# Patient Record
Sex: Female | Born: 1935 | Race: White | Hispanic: No | Smoking: Never smoker
Health system: Southern US, Community
[De-identification: ages and names within clinical notes are randomized; demographics above are authoritative.]

## PROBLEM LIST (undated history)

## (undated) DIAGNOSIS — E119 Type 2 diabetes mellitus without complications: Secondary | ICD-10-CM

## (undated) DIAGNOSIS — I10 Essential (primary) hypertension: Secondary | ICD-10-CM

---

## 2005-02-21 ENCOUNTER — Ambulatory Visit: Payer: Self-pay | Admitting: Internal Medicine

## 2005-03-06 ENCOUNTER — Ambulatory Visit: Payer: Self-pay | Admitting: Internal Medicine

## 2005-04-11 ENCOUNTER — Ambulatory Visit: Payer: Self-pay | Admitting: Ophthalmology

## 2006-04-24 ENCOUNTER — Ambulatory Visit: Payer: Self-pay | Admitting: Internal Medicine

## 2007-05-08 ENCOUNTER — Ambulatory Visit: Payer: Self-pay | Admitting: Internal Medicine

## 2007-06-12 ENCOUNTER — Ambulatory Visit: Payer: Self-pay | Admitting: Internal Medicine

## 2008-08-25 ENCOUNTER — Ambulatory Visit: Payer: Self-pay | Admitting: Internal Medicine

## 2008-10-06 ENCOUNTER — Ambulatory Visit: Payer: Self-pay | Admitting: Internal Medicine

## 2009-12-15 ENCOUNTER — Ambulatory Visit: Payer: Self-pay | Admitting: Internal Medicine

## 2010-07-12 ENCOUNTER — Ambulatory Visit: Payer: Self-pay | Admitting: Internal Medicine

## 2011-01-19 ENCOUNTER — Ambulatory Visit: Payer: Self-pay | Admitting: Internal Medicine

## 2011-02-01 ENCOUNTER — Ambulatory Visit: Payer: Self-pay | Admitting: Internal Medicine

## 2011-04-19 ENCOUNTER — Ambulatory Visit: Payer: Self-pay | Admitting: Internal Medicine

## 2012-05-28 ENCOUNTER — Ambulatory Visit: Payer: Self-pay | Admitting: Ophthalmology

## 2012-05-28 LAB — POTASSIUM: Potassium: 4 mmol/L (ref 3.5–5.1)

## 2012-06-10 ENCOUNTER — Ambulatory Visit: Payer: Self-pay | Admitting: Ophthalmology

## 2014-04-21 ENCOUNTER — Inpatient Hospital Stay: Payer: Self-pay | Admitting: Internal Medicine

## 2014-04-21 LAB — URINALYSIS, COMPLETE
BILIRUBIN, UR: NEGATIVE
Glucose,UR: >=500 mg/dL (ref 0–75)
KETONE: NEGATIVE
NITRITE: POSITIVE
PH: 5 (ref 4.5–8.0)
Protein: 100
RBC,UR: 117 /HPF (ref 0–5)
SQUAMOUS EPITHELIAL: NONE SEEN
Specific Gravity: 1.014 (ref 1.003–1.030)
WBC UR: 121 /HPF (ref 0–5)

## 2014-04-21 LAB — CBC WITH DIFFERENTIAL/PLATELET
BASOS ABS: 0.1 10*3/uL (ref 0.0–0.1)
Basophil %: 0.4 %
EOS ABS: 0 10*3/uL (ref 0.0–0.7)
EOS PCT: 0 %
HCT: 35.7 % (ref 35.0–47.0)
HGB: 11.4 g/dL — ABNORMAL LOW (ref 12.0–16.0)
Lymphocyte #: 1.1 10*3/uL (ref 1.0–3.6)
Lymphocyte %: 5.4 %
MCH: 28.5 pg (ref 26.0–34.0)
MCHC: 31.8 g/dL — ABNORMAL LOW (ref 32.0–36.0)
MCV: 90 fL (ref 80–100)
MONO ABS: 1.6 x10 3/mm — AB (ref 0.2–0.9)
Monocyte %: 8.1 %
Neutrophil #: 16.9 10*3/uL — ABNORMAL HIGH (ref 1.4–6.5)
Neutrophil %: 86.1 %
Platelet: 211 10*3/uL (ref 150–440)
RBC: 3.98 10*6/uL (ref 3.80–5.20)
RDW: 14.4 % (ref 11.5–14.5)
WBC: 19.7 10*3/uL — ABNORMAL HIGH (ref 3.6–11.0)

## 2014-04-21 LAB — BASIC METABOLIC PANEL
Anion Gap: 7 (ref 7–16)
BUN: 30 mg/dL — ABNORMAL HIGH (ref 7–18)
CALCIUM: 9.6 mg/dL (ref 8.5–10.1)
Chloride: 102 mmol/L (ref 98–107)
Co2: 26 mmol/L (ref 21–32)
Creatinine: 1.14 mg/dL (ref 0.60–1.30)
EGFR (African American): 50 — ABNORMAL LOW
EGFR (Non-African Amer.): 43 — ABNORMAL LOW
Glucose: 265 mg/dL — ABNORMAL HIGH (ref 65–99)
Osmolality: 286 (ref 275–301)
Potassium: 3.7 mmol/L (ref 3.5–5.1)
SODIUM: 135 mmol/L — AB (ref 136–145)

## 2014-04-21 LAB — TROPONIN I: Troponin-I: <0.02 ng/mL

## 2014-04-22 LAB — BASIC METABOLIC PANEL
Anion Gap: 8 (ref 7–16)
BUN: 28 mg/dL — ABNORMAL HIGH (ref 7–18)
CALCIUM: 9.2 mg/dL (ref 8.5–10.1)
CO2: 27 mmol/L (ref 21–32)
Chloride: 103 mmol/L (ref 98–107)
Creatinine: 0.73 mg/dL (ref 0.60–1.30)
EGFR (African American): >60
EGFR (Non-African Amer.): >60
Glucose: 271 mg/dL — ABNORMAL HIGH (ref 65–99)
OSMOLALITY: 291 (ref 275–301)
Potassium: 3.1 mmol/L — ABNORMAL LOW (ref 3.5–5.1)
Sodium: 138 mmol/L (ref 136–145)

## 2014-04-22 LAB — CBC WITH DIFFERENTIAL/PLATELET
BASOS PCT: 0.1 %
Basophil #: 0 10*3/uL (ref 0.0–0.1)
Eosinophil #: 0 10*3/uL (ref 0.0–0.7)
Eosinophil %: 0 %
HCT: 32.5 % — AB (ref 35.0–47.0)
HGB: 10.7 g/dL — ABNORMAL LOW (ref 12.0–16.0)
LYMPHS PCT: 5 %
Lymphocyte #: 0.8 10*3/uL — ABNORMAL LOW (ref 1.0–3.6)
MCH: 29.2 pg (ref 26.0–34.0)
MCHC: 32.9 g/dL (ref 32.0–36.0)
MCV: 89 fL (ref 80–100)
MONO ABS: 1.2 x10 3/mm — AB (ref 0.2–0.9)
MONOS PCT: 7.6 %
NEUTROS ABS: 13.5 10*3/uL — AB (ref 1.4–6.5)
Neutrophil %: 87.3 %
Platelet: 187 10*3/uL (ref 150–440)
RBC: 3.66 10*6/uL — AB (ref 3.80–5.20)
RDW: 14 % (ref 11.5–14.5)
WBC: 15.4 10*3/uL — AB (ref 3.6–11.0)

## 2014-04-23 LAB — CBC WITH DIFFERENTIAL/PLATELET
Basophil #: 0 10*3/uL (ref 0.0–0.1)
Basophil %: 0.3 %
EOS ABS: 0 10*3/uL (ref 0.0–0.7)
Eosinophil %: 0.1 %
HCT: 31.1 % — ABNORMAL LOW (ref 35.0–47.0)
HGB: 10.2 g/dL — AB (ref 12.0–16.0)
Lymphocyte #: 0.7 10*3/uL — ABNORMAL LOW (ref 1.0–3.6)
Lymphocyte %: 6.5 %
MCH: 29.4 pg (ref 26.0–34.0)
MCHC: 33 g/dL (ref 32.0–36.0)
MCV: 89 fL (ref 80–100)
Monocyte #: 1 x10 3/mm — ABNORMAL HIGH (ref 0.2–0.9)
Monocyte %: 9 %
NEUTROS ABS: 9.6 10*3/uL — AB (ref 1.4–6.5)
NEUTROS PCT: 84.1 %
Platelet: 154 10*3/uL (ref 150–440)
RBC: 3.48 10*6/uL — AB (ref 3.80–5.20)
RDW: 14.1 % (ref 11.5–14.5)
WBC: 11.4 10*3/uL — AB (ref 3.6–11.0)

## 2014-04-23 LAB — BASIC METABOLIC PANEL
Anion Gap: 9 (ref 7–16)
BUN: 26 mg/dL — AB (ref 7–18)
CREATININE: 0.68 mg/dL (ref 0.60–1.30)
Calcium, Total: 9 mg/dL (ref 8.5–10.1)
Chloride: 106 mmol/L (ref 98–107)
Co2: 24 mmol/L (ref 21–32)
EGFR (Non-African Amer.): >60
Glucose: 208 mg/dL — ABNORMAL HIGH (ref 65–99)
Osmolality: 288 (ref 275–301)
Potassium: 3.1 mmol/L — ABNORMAL LOW (ref 3.5–5.1)
Sodium: 139 mmol/L (ref 136–145)

## 2014-04-24 LAB — BASIC METABOLIC PANEL
Anion Gap: 5 — ABNORMAL LOW (ref 7–16)
BUN: 21 mg/dL — ABNORMAL HIGH (ref 7–18)
Calcium, Total: 8.8 mg/dL (ref 8.5–10.1)
Chloride: 110 mmol/L — ABNORMAL HIGH (ref 98–107)
Co2: 25 mmol/L (ref 21–32)
Creatinine: 0.75 mg/dL (ref 0.60–1.30)
Glucose: 158 mg/dL — ABNORMAL HIGH (ref 65–99)
OSMOLALITY: 286 (ref 275–301)
Potassium: 3.8 mmol/L (ref 3.5–5.1)
Sodium: 140 mmol/L (ref 136–145)

## 2014-04-24 LAB — URINE CULTURE

## 2014-04-24 LAB — CULTURE, BLOOD (SINGLE)

## 2014-04-24 LAB — CBC WITH DIFFERENTIAL/PLATELET
Basophil #: 0 10*3/uL (ref 0.0–0.1)
Basophil %: 0.2 %
Eosinophil #: 0 10*3/uL (ref 0.0–0.7)
Eosinophil %: 0.3 %
HCT: 30.2 % — ABNORMAL LOW (ref 35.0–47.0)
HGB: 9.9 g/dL — ABNORMAL LOW (ref 12.0–16.0)
LYMPHS PCT: 10.7 %
Lymphocyte #: 1 10*3/uL (ref 1.0–3.6)
MCH: 29.3 pg (ref 26.0–34.0)
MCHC: 32.8 g/dL (ref 32.0–36.0)
MCV: 89 fL (ref 80–100)
MONOS PCT: 14.9 %
Monocyte #: 1.4 x10 3/mm — ABNORMAL HIGH (ref 0.2–0.9)
NEUTROS ABS: 7 10*3/uL — AB (ref 1.4–6.5)
Neutrophil %: 73.9 %
PLATELETS: 152 10*3/uL (ref 150–440)
RBC: 3.38 10*6/uL — ABNORMAL LOW (ref 3.80–5.20)
RDW: 14.2 % (ref 11.5–14.5)
WBC: 9.4 10*3/uL (ref 3.6–11.0)

## 2014-04-25 LAB — BASIC METABOLIC PANEL
Anion Gap: 4 — ABNORMAL LOW (ref 7–16)
BUN: 19 mg/dL — AB (ref 7–18)
CALCIUM: 8.7 mg/dL (ref 8.5–10.1)
Chloride: 110 mmol/L — ABNORMAL HIGH (ref 98–107)
Co2: 26 mmol/L (ref 21–32)
Creatinine: 0.77 mg/dL (ref 0.60–1.30)
EGFR (African American): >60
EGFR (Non-African Amer.): >60
Glucose: 147 mg/dL — ABNORMAL HIGH (ref 65–99)
Osmolality: 284 (ref 275–301)
POTASSIUM: 3.7 mmol/L (ref 3.5–5.1)
SODIUM: 140 mmol/L (ref 136–145)

## 2014-04-25 LAB — CBC WITH DIFFERENTIAL/PLATELET
Basophil #: 0 10*3/uL (ref 0.0–0.1)
Basophil %: 0.4 %
EOS ABS: 0.2 10*3/uL (ref 0.0–0.7)
Eosinophil %: 2.6 %
HCT: 30.5 % — ABNORMAL LOW (ref 35.0–47.0)
HGB: 9.9 g/dL — ABNORMAL LOW (ref 12.0–16.0)
LYMPHS PCT: 17.5 %
Lymphocyte #: 1.3 10*3/uL (ref 1.0–3.6)
MCH: 28.9 pg (ref 26.0–34.0)
MCHC: 32.3 g/dL (ref 32.0–36.0)
MCV: 89 fL (ref 80–100)
MONOS PCT: 15.1 %
Monocyte #: 1.2 x10 3/mm — ABNORMAL HIGH (ref 0.2–0.9)
Neutrophil #: 4.9 10*3/uL (ref 1.4–6.5)
Neutrophil %: 64.4 %
PLATELETS: 168 10*3/uL (ref 150–440)
RBC: 3.41 10*6/uL — ABNORMAL LOW (ref 3.80–5.20)
RDW: 14.6 % — ABNORMAL HIGH (ref 11.5–14.5)
WBC: 7.6 10*3/uL (ref 3.6–11.0)

## 2014-04-26 LAB — BASIC METABOLIC PANEL
Anion Gap: 6 — ABNORMAL LOW (ref 7–16)
BUN: 14 mg/dL (ref 7–18)
Calcium, Total: 8.6 mg/dL (ref 8.5–10.1)
Chloride: 109 mmol/L — ABNORMAL HIGH (ref 98–107)
Co2: 25 mmol/L (ref 21–32)
Creatinine: 0.48 mg/dL — ABNORMAL LOW (ref 0.60–1.30)
EGFR (Non-African Amer.): >60
Glucose: 134 mg/dL — ABNORMAL HIGH (ref 65–99)
Osmolality: 282 (ref 275–301)
Potassium: 3.2 mmol/L — ABNORMAL LOW (ref 3.5–5.1)
SODIUM: 140 mmol/L (ref 136–145)

## 2014-04-26 LAB — HEMOGLOBIN: HGB: 10.3 g/dL — AB (ref 12.0–16.0)

## 2014-04-27 LAB — BASIC METABOLIC PANEL
Anion Gap: 6 — ABNORMAL LOW (ref 7–16)
BUN: 12 mg/dL (ref 7–18)
Calcium, Total: 8.7 mg/dL (ref 8.5–10.1)
Chloride: 105 mmol/L (ref 98–107)
Co2: 27 mmol/L (ref 21–32)
Creatinine: 0.49 mg/dL — ABNORMAL LOW (ref 0.60–1.30)
GLUCOSE: 188 mg/dL — AB (ref 65–99)
OSMOLALITY: 280 (ref 275–301)
Potassium: 3.1 mmol/L — ABNORMAL LOW (ref 3.5–5.1)
Sodium: 138 mmol/L (ref 136–145)

## 2014-05-29 LAB — COMPREHENSIVE METABOLIC PANEL
ALBUMIN: 2.6 g/dL — AB (ref 3.4–5.0)
ALT: 15 U/L (ref 12–78)
Alkaline Phosphatase: 115 U/L
Anion Gap: 6 — ABNORMAL LOW (ref 7–16)
BUN: 20 mg/dL — ABNORMAL HIGH (ref 7–18)
Bilirubin,Total: 0.4 mg/dL (ref 0.2–1.0)
CO2: 26 mmol/L (ref 21–32)
Calcium, Total: 9.3 mg/dL (ref 8.5–10.1)
Chloride: 105 mmol/L (ref 98–107)
Creatinine: 1.3 mg/dL (ref 0.60–1.30)
EGFR (Non-African Amer.): 37 — ABNORMAL LOW
GFR CALC AF AMER: 43 — AB
GLUCOSE: 437 mg/dL — AB (ref 65–99)
Osmolality: 295 (ref 275–301)
Potassium: 4.7 mmol/L (ref 3.5–5.1)
SGOT(AST): 14 U/L — ABNORMAL LOW (ref 15–37)
SODIUM: 137 mmol/L (ref 136–145)
TOTAL PROTEIN: 7.6 g/dL (ref 6.4–8.2)

## 2014-05-29 LAB — CBC WITH DIFFERENTIAL/PLATELET
BASOS PCT: 0.5 %
Basophil #: 0.1 10*3/uL (ref 0.0–0.1)
EOS PCT: 0.2 %
Eosinophil #: 0.1 10*3/uL (ref 0.0–0.7)
HCT: 34.9 % — ABNORMAL LOW (ref 35.0–47.0)
HGB: 11.2 g/dL — ABNORMAL LOW (ref 12.0–16.0)
LYMPHS PCT: 6.6 %
Lymphocyte #: 1.7 10*3/uL (ref 1.0–3.6)
MCH: 28.2 pg (ref 26.0–34.0)
MCHC: 32.2 g/dL (ref 32.0–36.0)
MCV: 88 fL (ref 80–100)
MONOS PCT: 4.8 %
Monocyte #: 1.2 x10 3/mm — ABNORMAL HIGH (ref 0.2–0.9)
Neutrophil #: 22.6 10*3/uL — ABNORMAL HIGH (ref 1.4–6.5)
Neutrophil %: 87.9 %
PLATELETS: 532 10*3/uL — AB (ref 150–440)
RBC: 3.98 10*6/uL (ref 3.80–5.20)
RDW: 15.6 % — ABNORMAL HIGH (ref 11.5–14.5)
WBC: 25.7 10*3/uL — AB (ref 3.6–11.0)

## 2014-05-29 LAB — TROPONIN I: Troponin-I: <0.02 ng/mL

## 2014-05-29 LAB — LIPASE, BLOOD: LIPASE: 54 U/L — AB (ref 73–393)

## 2014-05-30 ENCOUNTER — Inpatient Hospital Stay: Payer: Self-pay | Admitting: Internal Medicine

## 2014-05-30 LAB — URINALYSIS, COMPLETE
BILIRUBIN, UR: NEGATIVE
Glucose,UR: >=500 mg/dL (ref 0–75)
Ketone: NEGATIVE
NITRITE: POSITIVE
Ph: 5 (ref 4.5–8.0)
RBC,UR: 33 /HPF (ref 0–5)
SPECIFIC GRAVITY: 1.017 (ref 1.003–1.030)
Squamous Epithelial: NONE SEEN

## 2014-05-31 LAB — CBC WITH DIFFERENTIAL/PLATELET
BASOS ABS: 0.1 10*3/uL (ref 0.0–0.1)
Basophil %: 0.3 %
EOS PCT: 1 %
Eosinophil #: 0.2 10*3/uL (ref 0.0–0.7)
HCT: 32.6 % — AB (ref 35.0–47.0)
HGB: 10.6 g/dL — AB (ref 12.0–16.0)
Lymphocyte #: 2 10*3/uL (ref 1.0–3.6)
Lymphocyte %: 10.9 %
MCH: 28.2 pg (ref 26.0–34.0)
MCHC: 32.5 g/dL (ref 32.0–36.0)
MCV: 87 fL (ref 80–100)
MONO ABS: 0.9 x10 3/mm (ref 0.2–0.9)
Monocyte %: 5.3 %
NEUTROS PCT: 82.5 %
Neutrophil #: 14.8 10*3/uL — ABNORMAL HIGH (ref 1.4–6.5)
Platelet: 474 10*3/uL — ABNORMAL HIGH (ref 150–440)
RBC: 3.76 10*6/uL — AB (ref 3.80–5.20)
RDW: 15.4 % — ABNORMAL HIGH (ref 11.5–14.5)
WBC: 17.9 10*3/uL — AB (ref 3.6–11.0)

## 2014-05-31 LAB — BASIC METABOLIC PANEL
Anion Gap: 8 (ref 7–16)
BUN: 13 mg/dL (ref 7–18)
CO2: 25 mmol/L (ref 21–32)
Calcium, Total: 8.7 mg/dL (ref 8.5–10.1)
Chloride: 111 mmol/L — ABNORMAL HIGH (ref 98–107)
Creatinine: 0.98 mg/dL (ref 0.60–1.30)
EGFR (African American): >60
EGFR (Non-African Amer.): 52 — ABNORMAL LOW
Glucose: 193 mg/dL — ABNORMAL HIGH (ref 65–99)
OSMOLALITY: 292 (ref 275–301)
Potassium: 3.9 mmol/L (ref 3.5–5.1)
SODIUM: 144 mmol/L (ref 136–145)

## 2014-05-31 LAB — MAGNESIUM: Magnesium: 1.3 mg/dL — ABNORMAL LOW

## 2014-05-31 LAB — CLOSTRIDIUM DIFFICILE(ARMC)

## 2014-06-02 LAB — BASIC METABOLIC PANEL
Anion Gap: 6 — ABNORMAL LOW (ref 7–16)
BUN: 11 mg/dL (ref 7–18)
Calcium, Total: 8.4 mg/dL — ABNORMAL LOW (ref 8.5–10.1)
Chloride: 108 mmol/L — ABNORMAL HIGH (ref 98–107)
Co2: 27 mmol/L (ref 21–32)
Creatinine: 0.99 mg/dL (ref 0.60–1.30)
EGFR (African American): 59 — ABNORMAL LOW
EGFR (Non-African Amer.): 51 — ABNORMAL LOW
Glucose: 224 mg/dL — ABNORMAL HIGH (ref 65–99)
Osmolality: 288 (ref 275–301)
POTASSIUM: 3.5 mmol/L (ref 3.5–5.1)
SODIUM: 141 mmol/L (ref 136–145)

## 2014-06-02 LAB — CBC WITH DIFFERENTIAL/PLATELET
Basophil #: 0.1 10*3/uL (ref 0.0–0.1)
Basophil %: 0.7 %
Eosinophil #: 0.3 10*3/uL (ref 0.0–0.7)
Eosinophil %: 2.2 %
HCT: 33.4 % — AB (ref 35.0–47.0)
HGB: 10.6 g/dL — ABNORMAL LOW (ref 12.0–16.0)
LYMPHS PCT: 17.7 %
Lymphocyte #: 2.1 10*3/uL (ref 1.0–3.6)
MCH: 27.6 pg (ref 26.0–34.0)
MCHC: 31.6 g/dL — ABNORMAL LOW (ref 32.0–36.0)
MCV: 87 fL (ref 80–100)
MONOS PCT: 6.9 %
Monocyte #: 0.8 x10 3/mm (ref 0.2–0.9)
Neutrophil #: 8.5 10*3/uL — ABNORMAL HIGH (ref 1.4–6.5)
Neutrophil %: 72.5 %
Platelet: 434 10*3/uL (ref 150–440)
RBC: 3.83 10*6/uL (ref 3.80–5.20)
RDW: 15.5 % — AB (ref 11.5–14.5)
WBC: 11.7 10*3/uL — ABNORMAL HIGH (ref 3.6–11.0)

## 2014-06-04 LAB — CULTURE, BLOOD (SINGLE)

## 2014-10-28 ENCOUNTER — Emergency Department (HOSPITAL_COMMUNITY): Payer: Medicare HMO

## 2014-10-28 ENCOUNTER — Encounter (HOSPITAL_COMMUNITY): Payer: Self-pay

## 2014-10-28 ENCOUNTER — Inpatient Hospital Stay (HOSPITAL_COMMUNITY)
Admission: EM | Admit: 2014-10-28 | Discharge: 2014-11-03 | DRG: 064 | Disposition: A | Discharge status: Skilled Nursing Facility | Payer: Medicare HMO | Attending: Neurology | Admitting: Neurology

## 2014-10-28 DIAGNOSIS — R1311 Dysphagia, oral phase: Secondary | ICD-10-CM | POA: Diagnosis present

## 2014-10-28 DIAGNOSIS — Z66 Do not resuscitate: Secondary | ICD-10-CM | POA: Diagnosis present

## 2014-10-28 DIAGNOSIS — G936 Cerebral edema: Secondary | ICD-10-CM | POA: Diagnosis present

## 2014-10-28 DIAGNOSIS — I619 Nontraumatic intracerebral hemorrhage, unspecified: Secondary | ICD-10-CM | POA: Diagnosis present

## 2014-10-28 DIAGNOSIS — I6203 Nontraumatic chronic subdural hemorrhage: Secondary | ICD-10-CM | POA: Diagnosis present

## 2014-10-28 DIAGNOSIS — S065XAA Traumatic subdural hemorrhage with loss of consciousness status unknown, initial encounter: Secondary | ICD-10-CM

## 2014-10-28 DIAGNOSIS — R4701 Aphasia: Secondary | ICD-10-CM | POA: Diagnosis present

## 2014-10-28 DIAGNOSIS — I69122 Dysarthria following nontraumatic intracerebral hemorrhage: Secondary | ICD-10-CM | POA: Diagnosis not present

## 2014-10-28 DIAGNOSIS — G8191 Hemiplegia, unspecified affecting right dominant side: Secondary | ICD-10-CM | POA: Diagnosis present

## 2014-10-28 DIAGNOSIS — E876 Hypokalemia: Secondary | ICD-10-CM

## 2014-10-28 DIAGNOSIS — B9620 Unspecified Escherichia coli [E. coli] as the cause of diseases classified elsewhere: Secondary | ICD-10-CM | POA: Diagnosis present

## 2014-10-28 DIAGNOSIS — E1165 Type 2 diabetes mellitus with hyperglycemia: Secondary | ICD-10-CM | POA: Diagnosis present

## 2014-10-28 DIAGNOSIS — Z880 Allergy status to penicillin: Secondary | ICD-10-CM | POA: Diagnosis not present

## 2014-10-28 DIAGNOSIS — S065X9A Traumatic subdural hemorrhage with loss of consciousness of unspecified duration, initial encounter: Secondary | ICD-10-CM

## 2014-10-28 DIAGNOSIS — I639 Cerebral infarction, unspecified: Secondary | ICD-10-CM

## 2014-10-28 DIAGNOSIS — Z79899 Other long term (current) drug therapy: Secondary | ICD-10-CM | POA: Diagnosis not present

## 2014-10-28 DIAGNOSIS — R29810 Facial weakness: Secondary | ICD-10-CM | POA: Diagnosis present

## 2014-10-28 DIAGNOSIS — I10 Essential (primary) hypertension: Secondary | ICD-10-CM

## 2014-10-28 DIAGNOSIS — I83009 Varicose veins of unspecified lower extremity with ulcer of unspecified site: Secondary | ICD-10-CM | POA: Diagnosis present

## 2014-10-28 DIAGNOSIS — N390 Urinary tract infection, site not specified: Secondary | ICD-10-CM

## 2014-10-28 DIAGNOSIS — I612 Nontraumatic intracerebral hemorrhage in hemisphere, unspecified: Secondary | ICD-10-CM

## 2014-10-28 DIAGNOSIS — I618 Other nontraumatic intracerebral hemorrhage: Principal | ICD-10-CM | POA: Diagnosis present

## 2014-10-28 HISTORY — DX: Essential (primary) hypertension: I10

## 2014-10-28 HISTORY — DX: Type 2 diabetes mellitus without complications: E11.9

## 2014-10-28 LAB — I-STAT CHEM 8, ED
BUN: 27 mg/dL — ABNORMAL HIGH (ref 6–23)
CALCIUM ION: 1.24 mmol/L (ref 1.13–1.30)
CREATININE: 1 mg/dL (ref 0.50–1.10)
Chloride: 102 mEq/L (ref 96–112)
GLUCOSE: 167 mg/dL — AB (ref 70–99)
HCT: 39 % (ref 36.0–46.0)
Hemoglobin: 13.3 g/dL (ref 12.0–15.0)
POTASSIUM: 4.1 meq/L (ref 3.7–5.3)
Sodium: 139 mEq/L (ref 137–147)
TCO2: 23 mmol/L (ref 0–100)

## 2014-10-28 LAB — COMPREHENSIVE METABOLIC PANEL
ALBUMIN: 3.6 g/dL (ref 3.5–5.2)
ALK PHOS: 69 U/L (ref 39–117)
ALT: 9 U/L (ref 0–35)
ANION GAP: 16 — AB (ref 5–15)
AST: 13 U/L (ref 0–37)
BILIRUBIN TOTAL: 0.3 mg/dL (ref 0.3–1.2)
BUN: 25 mg/dL — AB (ref 6–23)
CHLORIDE: 101 meq/L (ref 96–112)
CO2: 24 mEq/L (ref 19–32)
Calcium: 10.2 mg/dL (ref 8.4–10.5)
Creatinine, Ser: 0.93 mg/dL (ref 0.50–1.10)
GFR, EST AFRICAN AMERICAN: 62 mL/min — AB (ref >90)
GFR, EST NON AFRICAN AMERICAN: 53 mL/min — AB (ref >90)
GLUCOSE: 161 mg/dL — AB (ref 70–99)
Potassium: 4.3 mEq/L (ref 3.7–5.3)
Sodium: 141 mEq/L (ref 137–147)
Total Protein: 7.3 g/dL (ref 6.0–8.3)

## 2014-10-28 LAB — DIFFERENTIAL
Basophils Absolute: 0 10*3/uL (ref 0.0–0.1)
Basophils Relative: 0 % (ref 0–1)
Eosinophils Absolute: 0.1 10*3/uL (ref 0.0–0.7)
Eosinophils Relative: 1 % (ref 0–5)
LYMPHS ABS: 2 10*3/uL (ref 0.7–4.0)
Lymphocytes Relative: 18 % (ref 12–46)
MONO ABS: 0.7 10*3/uL (ref 0.1–1.0)
MONOS PCT: 6 % (ref 3–12)
NEUTROS ABS: 8.3 10*3/uL — AB (ref 1.7–7.7)
Neutrophils Relative %: 75 % (ref 43–77)

## 2014-10-28 LAB — CBC
HEMATOCRIT: 36.1 % (ref 36.0–46.0)
HEMOGLOBIN: 11.5 g/dL — AB (ref 12.0–15.0)
MCH: 27.5 pg (ref 26.0–34.0)
MCHC: 31.9 g/dL (ref 30.0–36.0)
MCV: 86.4 fL (ref 78.0–100.0)
Platelets: 290 10*3/uL (ref 150–400)
RBC: 4.18 MIL/uL (ref 3.87–5.11)
RDW: 15.9 % — ABNORMAL HIGH (ref 11.5–15.5)
WBC: 11.1 10*3/uL — ABNORMAL HIGH (ref 4.0–10.5)

## 2014-10-28 LAB — PROTIME-INR
INR: 1.16 (ref 0.00–1.49)
Prothrombin Time: 15 seconds (ref 11.6–15.2)

## 2014-10-28 LAB — I-STAT TROPONIN, ED: Troponin i, poc: 0.01 ng/mL (ref 0.00–0.08)

## 2014-10-28 LAB — APTT: aPTT: 33 seconds (ref 24–37)

## 2014-10-28 MED ORDER — STROKE: EARLY STAGES OF RECOVERY BOOK
Freq: Once | Status: AC
  Administered 2014-10-28: 22:00:00
  Filled 2014-10-28: qty 1

## 2014-10-28 MED ORDER — NICARDIPINE HCL IN NACL 20-0.86 MG/200ML-% IV SOLN
3.0000 mg/h | INTRAVENOUS | Status: DC
  Filled 2014-10-28: qty 200

## 2014-10-28 MED ORDER — ACETAMINOPHEN 650 MG RE SUPP: 650.0000 mg | RECTAL | Status: DC | PRN

## 2014-10-28 MED ORDER — LABETALOL HCL 5 MG/ML IV SOLN
10.0000 mg | INTRAVENOUS | Status: DC | PRN
  Administered 2014-10-29 – 2014-10-30 (×4): 20 mg via INTRAVENOUS
  Filled 2014-10-28: qty 4
  Filled 2014-10-28: qty 8
  Filled 2014-10-28: qty 4

## 2014-10-28 MED ORDER — LABETALOL HCL 5 MG/ML IV SOLN
10.0000 mg | Freq: Once | INTRAVENOUS | Status: AC
  Administered 2014-10-28: 10 mg via INTRAVENOUS
  Filled 2014-10-28: qty 4

## 2014-10-28 MED ORDER — SENNOSIDES-DOCUSATE SODIUM 8.6-50 MG PO TABS
1.0000 | ORAL_TABLET | Freq: Two times a day (BID) | ORAL | Status: DC
  Administered 2014-10-31 – 2014-11-03 (×6): 1 via ORAL
  Filled 2014-10-28 (×11): qty 1

## 2014-10-28 MED ORDER — ACETAMINOPHEN 325 MG PO TABS: 650.0000 mg | ORAL_TABLET | ORAL | Status: DC | PRN

## 2014-10-28 MED ORDER — PANTOPRAZOLE SODIUM 40 MG IV SOLR
40.0000 mg | Freq: Every day | INTRAVENOUS | Status: DC
  Administered 2014-10-29 – 2014-10-31 (×4): 40 mg via INTRAVENOUS
  Filled 2014-10-28 (×5): qty 40

## 2014-10-28 NOTE — ED Notes (Signed)
Family and pt on agreeance that pt would not like to be on a breathing tube.

## 2014-10-28 NOTE — ED Notes (Signed)
Per Dr. Amada JupiterKirkpatrick, ok to hold off on cardene drip due to BP 145/45.  Sts if pressure gets above 160, cardene drip may be started.

## 2014-10-28 NOTE — H&P (Addendum)
Neurology H&P  CC: Right-sided weakness  History is obtained from: Family, EMS   HPI: Sandy Harris is a 80 y.o. female with a history of hypertension and diabetes who presents with right-sided weakness. She was last seen normal around 4:30 PM, and then when her sister try to check on her she was unable to get a hold of her. EMS had to obtain access to the residence and found the patient collapsed on the floor. She had emesis at that time.  She is awake and the few things I am able to understand seem to make sense, but she is extraordinarily dysarthric.  She has markedly venous stasis changes with ulcerations bilaterally. There is some erythema, the family states that this is long-standing for months and the patient denies any pain.  LKW: 4:30 PM tpa given?: no, hemorrhage    ROS: Unable to obtain due to altered mental status.   Past Medical History  Diagnosis Date  . Hypertension   . Diabetes mellitus without complication     Family History: Unable to obtain due to altered mental status.  Social History: Tob: Unable to obtain due to altered mental status.  Exam: Current vital signs: BP 145/43 mmHg  Pulse 72  Resp 11  SpO2 99% Vital signs in last 24 hours: Pulse Rate:  [72-84] 72 (12/09 2030) Resp:  [11-15] 11 (12/09 2030) BP: (145-168)/(43-69) 145/43 mmHg (12/09 2030) SpO2:  [98 %-99 %] 99 % (12/09 2030)  General: In bed, c-collar in place  Physical Exam  Constitutional: Appears well-developed and well-nourished.  Psych: Affect appropriate to situation Eyes: No scleral injection HENT: No OP obstrucion Head: Normocephalic.  Cardiovascular: Normal rate and regular rhythm.  Respiratory: Effort normal and breath sounds normal to anterior ascultation GI: Soft.  No distension. There is no tenderness.  Skin: There are venous stasis changes with ulcerations bilaterally. There is some erythema.  Neuro: Mental Status: Patient is awake, alert, oriented to person,   month, age She nods and shakes head appropriately, but is very dysarthric to the point of not being able to understand her. Cranial Nerves: II: Visual Fields are full. Pupils are equal, round, and reactive to light.   III,IV, VI: She has a left gaze preference V: Facial sensation is decreased on the) VII: Facial movement is notable for right facial weakness VIII: hearing is intact to voice X: Uvula elevates symmetrically XI: Shoulder shrug is symmetric. XII: tongue is midline without atrophy or fasciculations.  Motor: Tone is normal. Bulk is normal. 5/5 strength was present in the left, she has minimal internal rotation of the right arm with increased tone, no voluntary movement of the right leg. Sensory: Sensation is decreased pinprick on the right Deep Tendon Reflexes: 2+ and symmetric in the biceps and decreased at patellae.  Cerebellar: She has a intentional tremor on finger-nose-finger on the left     I have reviewed labs in epic and the results pertinent to this consultation are: CMP - mildly elevated glucose, otherwise unremarkable CBC-mild leukocytosis  I have reviewed the images obtained: CT head-left thalamic hemorrhage  Impression: 80 year old female with left thalamic hemorrhage. Family and patient agree that she would not want to be intubated or have chest compressions in the event of a cardiac arrest or respiratory failure, but any care up to that point they would want to be aggressive.  She also has an extra-axial fluid collection most consistent with chronic subdural hematoma.   Recommendations: 1) Admit to ICU 2) no antiplatelets or  anticoagulants 3) blood pressure control with goal systolic <160 4) Frequent neuro checks 5) If symptoms worsen or there is decreased mental status, repeat stat head CT 6) PT,OT,ST 7) Nicardipine for elevated blood pressure. 8) Chronic venostasis ulcers - may want wound care consult in the morning.    This patient is critically  ill and at significant risk of neurological worsening, death and care requires constant monitoring of vital signs, hemodynamics,respiratory and cardiac monitoring, neurological assessment, discussion with family, other specialists and medical decision making of high complexity. I spent 50 minutes of neurocritical care time  in the care of  this patient.  Ritta SlotMcNeill Freyja Govea, MD [facility] [phone]  If 7pm- 7am, please page neurology on call as listed in AMION. 10/28/2014  8:58 PM

## 2014-10-28 NOTE — ED Notes (Addendum)
Pt from home with stroke symptoms.  Pt with right sided facial droop, and right sided weakness.  Pt found on floor by sister.  Pt had vomited; pt vomited once more with EMS.  LSN 1630 by family.

## 2014-10-29 ENCOUNTER — Encounter (HOSPITAL_COMMUNITY): Payer: Self-pay | Admitting: Neurology

## 2014-10-29 ENCOUNTER — Inpatient Hospital Stay (HOSPITAL_COMMUNITY): Payer: Medicare HMO

## 2014-10-29 DIAGNOSIS — I6309 Cerebral infarction due to thrombosis of other precerebral artery: Secondary | ICD-10-CM

## 2014-10-29 DIAGNOSIS — I610 Nontraumatic intracerebral hemorrhage in hemisphere, subcortical: Secondary | ICD-10-CM

## 2014-10-29 LAB — GLUCOSE, CAPILLARY
GLUCOSE-CAPILLARY: 166 mg/dL — AB (ref 70–99)
Glucose-Capillary: 180 mg/dL — ABNORMAL HIGH (ref 70–99)
Glucose-Capillary: 156 mg/dL — ABNORMAL HIGH (ref 70–99)

## 2014-10-29 LAB — MRSA PCR SCREENING: MRSA by PCR: POSITIVE — AB

## 2014-10-29 MED ORDER — HYDROCERIN EX CREA
TOPICAL_CREAM | Freq: Two times a day (BID) | CUTANEOUS | Status: DC
  Administered 2014-10-29: 11:00:00 via TOPICAL
  Administered 2014-10-29 – 2014-10-30 (×2): 1 via TOPICAL
  Administered 2014-10-30 – 2014-11-03 (×8): via TOPICAL
  Filled 2014-10-29: qty 113

## 2014-10-29 MED ORDER — SODIUM CHLORIDE 0.9 % IV SOLN
INTRAVENOUS | Status: DC
  Administered 2014-10-29 – 2014-10-31 (×3): via INTRAVENOUS

## 2014-10-29 MED ORDER — MUPIROCIN 2 % EX OINT
1.0000 "application " | TOPICAL_OINTMENT | Freq: Two times a day (BID) | CUTANEOUS | Status: DC
  Administered 2014-10-29 (×2): 1 via NASAL
  Filled 2014-10-29: qty 22

## 2014-10-29 MED ORDER — CHLORHEXIDINE GLUCONATE CLOTH 2 % EX PADS
6.0000 | MEDICATED_PAD | Freq: Every day | CUTANEOUS | Status: AC
  Administered 2014-10-29 – 2014-11-02 (×5): 6 via TOPICAL

## 2014-10-29 MED ORDER — MUPIROCIN 2 % EX OINT
1.0000 "application " | TOPICAL_OINTMENT | Freq: Two times a day (BID) | CUTANEOUS | Status: AC
  Administered 2014-10-29 – 2014-11-02 (×8): 1 via NASAL

## 2014-10-29 MED ORDER — CHLORHEXIDINE GLUCONATE 0.12 % MT SOLN
15.0000 mL | Freq: Two times a day (BID) | OROMUCOSAL | Status: DC
  Administered 2014-10-29 – 2014-11-03 (×10): 15 mL via OROMUCOSAL
  Filled 2014-10-29 (×10): qty 15

## 2014-10-29 MED ORDER — CETYLPYRIDINIUM CHLORIDE 0.05 % MT LIQD
7.0000 mL | Freq: Two times a day (BID) | OROMUCOSAL | Status: DC
  Administered 2014-10-29 – 2014-11-03 (×7): 7 mL via OROMUCOSAL

## 2014-10-29 MED ORDER — CETYLPYRIDINIUM CHLORIDE 0.05 % MT LIQD
7.0000 mL | Freq: Two times a day (BID) | OROMUCOSAL | Status: DC
  Administered 2014-10-29 (×2): 7 mL via OROMUCOSAL

## 2014-10-29 NOTE — Progress Notes (Signed)
Stroke Team Progress Note  HISTORY Sandy Harris is a 80 y.o. female with a history of hypertension and diabetes who presents with right-sided weakness. She was last seen normal around 4:30 PM, and then when her sister try to check on her she was unable to get a hold of her. EMS had to obtain access to the residence and found the patient collapsed on the floor. She had emesis at that time. She is awake and the few things I am able to understand seem to make sense, but she is extraordinarily dysarthric. She has markedly venous stasis changes with ulcerations bilaterally. There is some erythema, the family states that this is long-standing for months and the patient denies any pain.  LKW: 4:30 PM tpa given?: no, hemorrhage Patient was not administered TPA secondary to  Brain hemorrhage. She was admitted to the neuro ICU for further evaluation and treatment.  SUBJECTIVE Her  Family  Is not at the bedside.  Overall she feels her condition is unchanged. Her blood pressure has remained well controlled. There have been no significant neurological changes overnight. Follow-up CT scan shows slight increase in hematoma size and left-to-right midline shift but without hydrocephalus  OBJECTIVE Most recent Vital Signs: Filed Vitals:   10/29/14 1100 10/29/14 1200 10/29/14 1300 10/29/14 1400  BP: 140/43 151/62 151/83 143/51  Pulse: 74 88 75 75  Temp:  97.9 F (36.6 C)    TempSrc:  Oral    Resp: 13 12 16 12   Height:      Weight:      SpO2: 98% 94% 98% 98%   CBG (last 3)   Recent Labs  10/28/14 2124 10/29/14 0003 10/29/14 0520  GLUCAP 180* 166* 156*    IV Fluid Intake:   . sodium chloride 50 mL/hr at 10/29/14 1400  . niCARDipine      MEDICATIONS  . antiseptic oral rinse  7 mL Mouth Rinse q12n4p  . chlorhexidine  15 mL Mouth Rinse BID  . Chlorhexidine Gluconate Cloth  6 each Topical Q0600  . hydrocerin   Topical BID  . mupirocin ointment  1 application Nasal BID  . pantoprazole  (PROTONIX) IV  40 mg Intravenous QHS  . senna-docusate  1 tablet Oral BID   PRN:  acetaminophen **OR** acetaminophen, labetalol  Diet:  Diet NPO time specified   Activity:  Bedrest    DVT Prophylaxis:  SCDs CLINICALLY SIGNIFICANT STUDIES Basic Metabolic Panel:   Recent Labs Lab 10/28/14 1943 10/28/14 1952  NA 141 139  K 4.3 4.1  CL 101 102  CO2 24  --   GLUCOSE 161* 167*  BUN 25* 27*  CREATININE 0.93 1.00  CALCIUM 10.2  --    Liver Function Tests:   Recent Labs Lab 10/28/14 1943  AST 13  ALT 9  ALKPHOS 69  BILITOT 0.3  PROT 7.3  ALBUMIN 3.6   CBC:   Recent Labs Lab 10/28/14 1943 10/28/14 1952  WBC 11.1*  --   NEUTROABS 8.3*  --   HGB 11.5* 13.3  HCT 36.1 39.0  MCV 86.4  --   PLT 290  --    Coagulation:   Recent Labs Lab 10/28/14 1943  LABPROT 15.0  INR 1.16   Cardiac Enzymes: No results for input(s): CKTOTAL, CKMB, CKMBINDEX, TROPONINI in the last 168 hours. Urinalysis: No results for input(s): COLORURINE, LABSPEC, PHURINE, GLUCOSEU, HGBUR, BILIRUBINUR, KETONESUR, PROTEINUR, UROBILINOGEN, NITRITE, LEUKOCYTESUR in the last 168 hours.  Invalid input(s): APPERANCEUR Lipid PanelNo results found for: CHOL,  TRIG, HDL, CHOLHDL, VLDL, LDLCALC HgbA1C No results found for: HGBA1C  Urine Drug Screen:  No results found for: LABOPIA, COCAINSCRNUR, LABBENZ, AMPHETMU, THCU, LABBARB  Alcohol Level: No results for input(s): ETH in the last 168 hours.  Ct Head Wo Contrast  10/29/2014   CLINICAL DATA:  80 year old diabetic hypertensive female with facial droop and mental status changes with intracranial hemorrhage. Subsequent encounter.  EXAM: CT HEAD WITHOUT CONTRAST  TECHNIQUE: Contiguous axial images were obtained from the base of the skull through the vertex without intravenous contrast.  COMPARISON:  10/28/2014 CT.  FINDINGS: Increase in size of left thalamic hemorrhage now measuring 2.3 x 1.8 cm versus prior 1.7 x 1.6 cm. Increase amount of surrounding vasogenic  edema. Slight increased mass effect upon the left lateral ventricle with 5.4 mm midline shift versus prior 3.3 mm.  Small amount of blood is seen within the deep dependent aspect of the left lateral ventricle.  This constellation of findings may represent a hypertensive hemorrhage. Underlying parenchyma can be evaluated as this clears.  Asymmetric appearance of extra-axial spaces greater on the left. This may represent combination of subdural hygroma in addition to underlying atrophy.  Small vessel disease type changes. Remote infarct superior right lenticular nucleus extending to posterior right coronal radiata.  No intracranial mass lesion seen separate from above described findings.  No calvarial abnormality.  IMPRESSION: Increase in size of left thalamic hemorrhage now measuring 2.3 x 1.8 cm versus prior 1.7 x 1.6 cm. Increase amount of surrounding vasogenic edema. Slight increased mass effect upon the left lateral ventricle with 5.4 mm midline shift versus prior 3.3 mm.  Small amount of blood is seen within the deep dependent aspect of the left lateral ventricle.  Remainder of findings without significant change as noted above.  These results will be called to the ordering clinician or representative by the Radiologist Assistant, and communication documented in the PACS or zVision Dashboard.   Electronically Signed   By: Bridgett LarssonSteve  Olson M.D.   On: 10/29/2014 12:56   Ct Head (brain) Wo Contrast  10/28/2014   CLINICAL DATA:  Initial encounter for new onset facial droop and mental status changes.  EXAM: CT HEAD WITHOUT CONTRAST  TECHNIQUE: Contiguous axial images were obtained from the base of the skull through the vertex without intravenous contrast.  COMPARISON:  None.  FINDINGS: 1.6 cm intra-axial, intraparenchymal hemorrhage is identified in the left thalamus. There is a small amount of associated edema.  CSF density left sided supratentorial extra-axial fluid collections suggest the presence of a left-sided  chronic subdural hematoma. There is 3-4 mm of left to right midline shift which may be related to the hemorrhage and/or the chronic left extra-axial fluid collection.  Diffuse loss of parenchymal volume is consistent with atrophy. Lacunar infarct is identified in the right basal ganglia and corona radiata.  The visualized paranasal sinuses and mastoid air cells are clear.  IMPRESSION: Acute left-sided of the laminae can't hemorrhage with hematoma measuring about 1.6 cm.  Chronic left-sided extra-axial fluid collection measuring about 8 mm in thickness.  Three-4 mm left-to-right midline shift secondary to the left thalamic hemorrhage and/ or left extra-axial fluid collection.  Critical Value/emergent results were called by telephone at the time of interpretation on 10/28/2014 at 8:04 pm to Dr. Amada JupiterKirkpatrick, who verbally acknowledged these results.   Electronically Signed   By: Kennith CenterEric  Mansell M.D.   On: 10/28/2014 20:04       MRI of the brain  pending  MRA of the  brain  pending Carotid Doppler  Not ordered 2D Echocardiogram  Not ordered  CXR  pending  EKG   85 beats/min Sinus rhythm  Therapy Recommendations pending  Physical Exam   Elderly caucasian lady not in distress.    Afebrile. Head is nontraumatic. Neck is supple without bruit.  . Cardiac exam no murmur or gallop. Lungs are clear to auscultation. Distal pulses are well felt. Neurological Exam : Drowsy but can be easily aroused. Marked dysarthria and can speak a few words and short sentences and can be understood with great difficulty. Follows only occasional commands. Left gaze preference and looked partially to the right but not all the way. Blinks to threat on the left but not the right. Fundi were not visualized. Moderate right lower facial weakness. Tongue midline. Dense right hemiplegia with hypotonia in the right upper extremity. Marked paratonia in the right lower extremity with some tenderness around the right knee which is chronic per  the daughter. Purposeful antigravity strength on the left side. Right plantar upgoing left downgoing. Gait was not tested  ASSESSMENT [name] is a 79 y.o. female presenting with left thalamic hemorrhage likely hypertensive etiology with mild cytotoxic edema and mass effect on lateral ventricle with left to right brain shift but no hydrocephalus.    .  On no antiplatelet agents prior to admission. Now on no antiplatelet agentfor secondary stroke prevention. Patient with resultant dysarthria, mild aphasia, right facial droop and right hemiparesis. Stroke work up underway.   HT  Diabetes -mild hyperglycemia CBG 167   Hospital day # 1  TREATMENT/PLAN Continue close neurological monitoring and strict control of hypertension with blood pressure goal below 160 systolic for 24 hours and then 180 systolic thereafter. Repeat CT scan of the head does show slight increase in hematoma size and mass effect and midline shift but the patient is not a good candidate for long-term ventilation and aggressive measures. Patient is DO NOT RESUSCITATE but chemical code as per daughter. I had a long discussion with the daughter at the bedside and subsequently with her sister and son who came later as well. We will support her without doing aggressive measures were the next 1-2 days and see how she does. Family does not want aggressive measures including hypertonic saline or intubation or brain surgery I have personally examined this patient, reviewed notes, independently viewed imaging studies, participated in medical decision making and plan of care. I have made any additions or clarifications directly to the above note. Agree with note above.   This patient is critically ill and at significant risk of neurological worsening, death and care requires constant monitoring of vital signs, hemodynamics,respiratory and cardiac monitoring,review of multiple databases, neurological assessment, discussion with family,  other specialists and medical decision making of high complexity.I have made any additions or clarifications directly to the above note.  I spent 40 minutes of neurocritical care time  in the care of  this patient.  Delia Heady, MD Medical Director [facility] Stroke Center Pager: [phone] 10/29/2014 3:17 PM   SIGNED    To contact Stroke Continuity provider, please refer to [email or web address]. After hours, contact General Neurology

## 2014-10-29 NOTE — Plan of Care (Signed)
Problem: Acute Treatment Outcomes Goal: 02 Sats > 94% Outcome: Completed/Met Date Met:  10/29/14

## 2014-10-29 NOTE — Plan of Care (Signed)
Problem: Consults Goal: Intracranial Hemorrhage Patient Education See Patient Education Module for education specifics. Outcome: Progressing Goal: Skin Care Protocol Initiated - if Braden Score 18 or less If consults are not indicated, leave blank or document N/A Outcome: Not Progressing Goal: Diabetes Guidelines if Diabetic/Glucose > 140 If diabetic or lab glucose is > 140 mg/dl - Initiate Diabetes/Hyperglycemia Guidelines & Document Interventions  Outcome: Progressing  Problem: Acute Treatment Outcomes Goal: BP within ordered parameters Outcome: Progressing Goal: Airway maintained/protected Outcome: Progressing Goal: 02 Sats > 94% Outcome: Progressing Goal: Pain controlled Outcome: Completed/Met Date Met:  10/29/14 Goal: Nausea and vomiting controlled Outcome: Completed/Met Date Met:  10/29/14 Goal: Prognosis discussed with family/patient as appropriate Outcome: Progressing

## 2014-10-29 NOTE — Progress Notes (Signed)
PT Cancellation Note  Patient Details Name: Sandy Harris MRN: [id number] DOB: 01-23-1924   Cancelled Treatment:    Reason Eval/Treat Not Completed: Patient not medically ready.  Pt continues to be on bedrest at this time.  Will f/u tomorrow as appropriate.     Heith Haigler, Alison MurrayMegan F 10/29/2014, 10:43 AM

## 2014-10-29 NOTE — Progress Notes (Signed)
Due to pt's skin condition on her lower legs, Dr. Pearlean BrownieSethi d/c'd SCDs and anticipates starting lovenox Friday for DVT prophylaxis.

## 2014-10-29 NOTE — Evaluation (Signed)
Clinical/Bedside Swallow Evaluation Patient Details  Name: Sandy Harris MRN: [id number] Date of Birth: 1924-02-20  Today's Date: 10/29/2014 Time: 1211-1224 SLP Time Calculation (min) (ACUTE ONLY): 13 min  Past Medical History:  Past Medical History  Diagnosis Date  . Hypertension   . Diabetes mellitus without complication    Past Surgical History: History reviewed. No pertinent past surgical history. HPI:  Sandy Harris is an 80 y.o. female with a history of hypertension and diabetes who presents with right-sided weakness. CT Head shows left thalamic hemorrhage and midline shift.    Assessment / Plan / Recommendation Clinical Impression  Pt has limited movement of her articulators, with minimal labial opening to accept PO boluses that increases slightly with SLP cueing. A single ice chip elicits multiple swallows that lead into dry heaving. Pt was agreeable to additional trials of thin liquids and puree, however pt is unable propel the boluses through her oral cavity due to limited lingual and labial ROM. All liquids spilled anteriorly through lips, and purees were suctioned out by SLP. Pt is not yet ready for PO diet. SLP to return for continued PO trials to determine readiness as well as full cognitive-linguistic evaluation.    Aspiration Risk  Severe    Diet Recommendation NPO   Medication Administration: Via alternative means    Other  Recommendations Oral Care Recommendations: Oral care Q4 per protocol   Follow Up Recommendations  Inpatient Rehab;Skilled Nursing facility;24 hour supervision/assistance (pening PT/OT evaluations)    Frequency and Duration min 3x week  1 week   Pertinent Vitals/Pain Increase in RR (10 to 20) after swallowing ice chip    SLP Swallow Goals     Swallow Study Prior Functional Status       General Date of Onset: 10/28/14 HPI: Sandy Harris is an 80 y.o. female with a history of hypertension and diabetes who presents with right-sided  weakness. CT Head shows left thalamic hemorrhage and midline shift.  Type of Study: Bedside swallow evaluation Previous Swallow Assessment: none in chart Diet Prior to this Study: NPO Temperature Spikes Noted: No Respiratory Status: Room air History of Recent Intubation: No Behavior/Cognition: Alert;Cooperative;Requires cueing Oral Cavity - Dentition: Adequate natural dentition (as able to visualize) Self-Feeding Abilities: Needs assist Patient Positioning: Upright in bed Baseline Vocal Quality: Low vocal intensity (barely audible)    Oral/Motor/Sensory Function Overall Oral Motor/Sensory Function: Impaired (difficulty following commands, minimal movements noted) Facial Symmetry: Right droop   Ice Chips Ice chips: Impaired Presentation: Spoon Oral Phase Impairments: Reduced labial seal;Reduced lingual movement/coordination;Impaired anterior to posterior transit Oral Phase Functional Implications: Prolonged oral transit;Right anterior spillage;Left anterior spillage Pharyngeal Phase Impairments: Suspected delayed Swallow;Other (comments) (multiple swallows and dry heaving)   Thin Liquid Thin Liquid: Impaired Presentation: Spoon Oral Phase Impairments: Reduced labial seal;Poor awareness of bolus Oral Phase Functional Implications: Right anterior spillage;Left anterior spillage    Nectar Thick Nectar Thick Liquid: Not tested   Honey Thick Honey Thick Liquid: Not tested   Puree Puree: Impaired Presentation: Spoon Oral Phase Impairments: Reduced labial seal;Reduced lingual movement/coordination;Impaired anterior to posterior transit;Poor awareness of bolus Oral Phase Functional Implications: Right anterior spillage;Left anterior spillage   Solid   GO    Solid: Not tested        Maxcine HamLaura Paiewonsky, M.A. CCC-SLP [phone]  Maxcine Hamaiewonsky, Maurizio Geno 10/29/2014,1:35 PM

## 2014-10-29 NOTE — Consult Note (Addendum)
WOC wound consult note Reason for Consult: Consult requested for bilat legs.  Daughter at bedside states this is a chronic problem; they will resolve and heal, then return.  She wears compression stockings at home. Wound type: Scattered patchy areas of partial thickness skin loss to bilat anterior calves.  Generalized edema and erythremia, appearance consistent with venous stasis changes. Currently no significant open wounds or drainage which require topical treatment at this time. Measurement: Left great toe with chronic full thickness wound to tip; .5X.5X.1cm, dry scabbed area without odor or drainage.  Several blackened, long toenails which need to be trimmed; pt should follow-up with a podiatrist after discharge. Dressing procedure/placement/frequency: Eucerin cream to decrease occurrence of dry skin and itching and promote healing.  Bactroban to promote moist healing to left great toe.  Pt can resume compression stockings at home after discharge. Discussed plan of care with daughter at bedside; pt does not appear to understand but pt daughter denies further questions. Please re-consult if further assistance is needed.  Thank-you,  Cammie Mcgeeawn Yi Falletta MSN, RN, CWOCN, AlmaWCN-AP, CNS [phone]

## 2014-10-29 NOTE — Progress Notes (Signed)
UR completed.  Elo Marmolejos, RN BSN MHA CCM Trauma/Neuro ICU Case Manager [phone]  

## 2014-10-29 NOTE — Plan of Care (Signed)
Problem: Acute Treatment Outcomes Goal: Neuro exam at baseline or improved Outcome: Progressing     

## 2014-10-29 NOTE — Progress Notes (Signed)
OT Cancellation Note  Patient Details Name: Golden PopMattie H Wisehart MRN: [id number] DOB: [birth year]   Cancelled Treatment:    Reason Eval/Treat Not Completed: Patient not medically ready (BR)  Harolyn RutherfordJones, Acxel Dingee B  [phone] 10/29/2014, 7:14 AM

## 2014-10-29 NOTE — Plan of Care (Signed)
Problem: Consults Goal: Skin Care Protocol Initiated - if Braden Score 18 or less If consults are not indicated, leave blank or document N/A  Outcome: Completed/Met Date Met:  10/29/14  Problem: Acute Treatment Outcomes Goal: Prognosis discussed with family/patient as appropriate Outcome: Completed/Met Date Met:  10/29/14  Problem: Progression Outcomes Goal: If vent dependent, tolerates weaning Outcome: Not Applicable Date Met:  18/98/42

## 2014-10-30 MED ORDER — CIPROFLOXACIN IN D5W 400 MG/200ML IV SOLN
400.0000 mg | Freq: Once | INTRAVENOUS | Status: AC
  Administered 2014-10-30: 400 mg via INTRAVENOUS
  Filled 2014-10-30 (×2): qty 200

## 2014-10-30 MED ORDER — ENOXAPARIN SODIUM 40 MG/0.4ML ~~LOC~~ SOLN
40.0000 mg | Freq: Every morning | SUBCUTANEOUS | Status: DC
  Administered 2014-10-30 – 2014-11-03 (×5): 40 mg via SUBCUTANEOUS
  Filled 2014-10-30 (×6): qty 0.4

## 2014-10-30 NOTE — ED Provider Notes (Signed)
CSN: [id number]     Arrival date & time 10/28/14  1941 History   First MD Initiated Contact with Patient 10/28/14 2007     Chief Complaint  Patient presents with  . Code Stroke     (Consider location/radiation/quality/duration/timing/severity/associated sxs/prior Treatment) Patient is a 80 y.o. female presenting with neurologic complaint.  Neurologic Problem This is a new problem. The current episode started today. The problem occurs constantly. The problem has been unchanged. Pertinent negatives include no fever.    Past Medical History  Diagnosis Date  . Hypertension   . Diabetes mellitus without complication    History reviewed. No pertinent past surgical history. History reviewed. No pertinent family history. History  Substance Use Topics  . Smoking status: Never Smoker   . Smokeless tobacco: Not on file  . Alcohol Use: Not on file   OB History    No data available     Review of Systems  Unable to perform ROS: Mental status change  Constitutional: Negative for fever.      Allergies  Penicillins  Home Medications   Prior to Admission medications   Medication Sig Start Date End Date Taking? Authorizing Provider  acetaminophen (TYLENOL) 325 MG tablet Take 650 mg by mouth every 6 (six) hours as needed for mild pain.   Yes Historical Provider, MD  glipiZIDE (GLUCOTROL) 10 MG tablet Take 10 mg by mouth 2 (two) times daily before a meal.  10/19/14  Yes Historical Provider, MD  meloxicam (MOBIC) 7.5 MG tablet Take 7.5 mg by mouth daily.  10/19/14  Yes Historical Provider, MD  metFORMIN (GLUCOPHAGE) 1000 MG tablet Take 1,000 mg by mouth 2 (two) times daily with a meal.  10/19/14  Yes Historical Provider, MD  metoprolol tartrate (LOPRESSOR) 25 MG tablet Take 25 mg by mouth 2 (two) times daily.  10/19/14  Yes Historical Provider, MD  omeprazole (PRILOSEC) 20 MG capsule Take 20 mg by mouth daily.  10/19/14  Yes Historical Provider, MD  pioglitazone (ACTOS) 45 MG tablet  Take 45 mg by mouth daily.   Yes Historical Provider, MD  SYNTHROID 125 MCG tablet Take 125 mcg by mouth daily before breakfast.  10/19/14  Yes Historical Provider, MD  VOLTAREN 1 % GEL Apply 4 g topically 2 (two) times daily.  10/20/14  Yes Historical Provider, MD   BP 159/70 mmHg  Pulse 72  Temp(Src) 98.3 F (36.8 C) (Oral)  Resp 17  Ht 5\' 4"  (1.626 m)  Wt 179 lb 0.2 oz (81.2 kg)  BMI 30.71 kg/m2  SpO2 98% Physical Exam  Constitutional: She is oriented to person, place, and time. She appears well-developed and well-nourished.  HENT:  Head: Normocephalic and atraumatic.  Eyes: Conjunctivae and EOM are normal. Right eye exhibits no discharge. Left eye exhibits no discharge.  Cardiovascular: Normal rate and regular rhythm.   Pulmonary/Chest: Effort normal and breath sounds normal. No respiratory distress.  Abdominal: Soft. She exhibits no distension.  Musculoskeletal: Normal range of motion. She exhibits no edema.  Neurological: She is alert and oriented to person, place, and time. A cranial nerve deficit (right facial droop) is present.  Right upper extremity weakness, not cooperating with exam on lower extremities.   Skin: Skin is warm and dry.  Nursing note and vitals reviewed.   ED Course  Procedures (including critical care time) Labs Review Labs Reviewed  MRSA PCR SCREENING - Abnormal; Notable for the following:    MRSA by PCR POSITIVE (*)    All other components within  normal limits  CBC - Abnormal; Notable for the following:    WBC 11.1 (*)    Hemoglobin 11.5 (*)    RDW 15.9 (*)    All other components within normal limits  DIFFERENTIAL - Abnormal; Notable for the following:    Neutro Abs 8.3 (*)    All other components within normal limits  COMPREHENSIVE METABOLIC PANEL - Abnormal; Notable for the following:    Glucose, Bld 161 (*)    BUN 25 (*)    GFR calc non Af Amer 53 (*)    GFR calc Af Amer 62 (*)    Anion gap 16 (*)    All other components within normal  limits  GLUCOSE, CAPILLARY - Abnormal; Notable for the following:    Glucose-Capillary 180 (*)    All other components within normal limits  GLUCOSE, CAPILLARY - Abnormal; Notable for the following:    Glucose-Capillary 166 (*)    All other components within normal limits  GLUCOSE, CAPILLARY - Abnormal; Notable for the following:    Glucose-Capillary 156 (*)    All other components within normal limits  I-STAT CHEM 8, ED - Abnormal; Notable for the following:    BUN 27 (*)    Glucose, Bld 167 (*)    All other components within normal limits  URINE CULTURE  PROTIME-INR  APTT  CBG MONITORING, ED  I-STAT TROPOININ, ED    Imaging Review Ct Head Wo Contrast  10/29/2014   CLINICAL DATA:  80 year old diabetic hypertensive female with facial droop and mental status changes with intracranial hemorrhage. Subsequent encounter.  EXAM: CT HEAD WITHOUT CONTRAST  TECHNIQUE: Contiguous axial images were obtained from the base of the skull through the vertex without intravenous contrast.  COMPARISON:  10/28/2014 CT.  FINDINGS: Increase in size of left thalamic hemorrhage now measuring 2.3 x 1.8 cm versus prior 1.7 x 1.6 cm. Increase amount of surrounding vasogenic edema. Slight increased mass effect upon the left lateral ventricle with 5.4 mm midline shift versus prior 3.3 mm.  Small amount of blood is seen within the deep dependent aspect of the left lateral ventricle.  This constellation of findings may represent a hypertensive hemorrhage. Underlying parenchyma can be evaluated as this clears.  Asymmetric appearance of extra-axial spaces greater on the left. This may represent combination of subdural hygroma in addition to underlying atrophy.  Small vessel disease type changes. Remote infarct superior right lenticular nucleus extending to posterior right coronal radiata.  No intracranial mass lesion seen separate from above described findings.  No calvarial abnormality.  IMPRESSION: Increase in size of left  thalamic hemorrhage now measuring 2.3 x 1.8 cm versus prior 1.7 x 1.6 cm. Increase amount of surrounding vasogenic edema. Slight increased mass effect upon the left lateral ventricle with 5.4 mm midline shift versus prior 3.3 mm.  Small amount of blood is seen within the deep dependent aspect of the left lateral ventricle.  Remainder of findings without significant change as noted above.  These results will be called to the ordering clinician or representative by the Radiologist Assistant, and communication documented in the PACS or zVision Dashboard.   Electronically Signed   By: Bridgett LarssonSteve  Olson M.D.   On: 10/29/2014 12:56   Ct Head (brain) Wo Contrast  10/28/2014   CLINICAL DATA:  Initial encounter for new onset facial droop and mental status changes.  EXAM: CT HEAD WITHOUT CONTRAST  TECHNIQUE: Contiguous axial images were obtained from the base of the skull through the vertex without intravenous  contrast.  COMPARISON:  None.  FINDINGS: 1.6 cm intra-axial, intraparenchymal hemorrhage is identified in the left thalamus. There is a small amount of associated edema.  CSF density left sided supratentorial extra-axial fluid collections suggest the presence of a left-sided chronic subdural hematoma. There is 3-4 mm of left to right midline shift which may be related to the hemorrhage and/or the chronic left extra-axial fluid collection.  Diffuse loss of parenchymal volume is consistent with atrophy. Lacunar infarct is identified in the right basal ganglia and corona radiata.  The visualized paranasal sinuses and mastoid air cells are clear.  IMPRESSION: Acute left-sided of the laminae can't hemorrhage with hematoma measuring about 1.6 cm.  Chronic left-sided extra-axial fluid collection measuring about 8 mm in thickness.  Three-4 mm left-to-right midline shift secondary to the left thalamic hemorrhage and/ or left extra-axial fluid collection.  Critical Value/emergent results were called by telephone at the time of  interpretation on 10/28/2014 at 8:04 pm to Dr. Amada Jupiter, who verbally acknowledged these results.   Electronically Signed   By: Kennith Center M.D.   On: 10/28/2014 20:04     EKG Interpretation   Date/Time:  Wednesday October 28 2014 19:59:02 EST Ventricular Rate:  85 PR Interval:  195 QRS Duration: 92 QT Interval:  395 QTC Calculation: 470 R Axis:   62 Text Interpretation:  Sinus rhythm ED PHYSICIAN INTERPRETATION AVAILABLE  IN [facility] Confirmed by TEST, Record ([id number]) on 10/30/2014 8:05:45  AM      MDM   Final diagnoses:  CVA (cerebral infarction)  Urinary tract infection without hematuria, site unspecified    80 yo F here as code stroke 2/2 last known normal 1630, right sided facial droop and right sided weakness. Multiple episodes of vomiting, hypertensive, still not a&o here but does not want intubation. c collar cleared. Found to have head bleed. cardene initiated for BP, admitted to ICU with Neurology in guarded condition.     Marily Memos, MD 10/30/14 1713  Marily Memos, MD 10/30/14 6045  Purvis Sheffield, MD 10/30/14 2035

## 2014-10-30 NOTE — Progress Notes (Signed)
INITIAL NUTRITION ASSESSMENT  DOCUMENTATION CODES Per approved criteria  -Obesity Unspecified   INTERVENTION: Once diet advanced recommend adding Magic cup TID with meals, each supplement provides 290 kcal and 9 grams of protein  NUTRITION DIAGNOSIS: Increased nutrient needs related to wound healing as evidenced by estimated needs.   Goal: Pt to meet >/= 90% of their estimated nutrition needs   Monitor:  Diet advancement, PO intake, supplement acceptance, weight trends  Reason for Assessment: Low Braden  80 y.o. female  Admitting Dx: Left thalamic hemorrhage  ASSESSMENT: Pt sleepy. Per family pt has been going downhill for the last year. Pt was living at home alone when she had her first UTI. She was in the hospital about a week then went to ALF for 6 weeks. After returning home she had another UTI which required hospitalization and ALF as well. Per family pt was less and less mobile. She has venous statis ulcers as well as a stage II ulcer on her toe.  24 hr recall: Breakfast: cereal or cheese toast, Lunch: soup and sandwich, Dinner: chicken and frozen veggies.  Pt has continued to refuse ensure/boost at home.  SLP to reevaluate swallow 12/12 and is hopeful to start PO diet.   Nutrition Focused Physical Exam:  Subcutaneous Fat:  Orbital Region: WDL Upper Arm Region: WDL Thoracic and Lumbar Region: WDL  Muscle:  Temple Region: WDL Clavicle Bone Region: WDL Clavicle and Acromion Bone Region: WDL Scapular Bone Region: WDL Dorsal Hand: WDL Patellar Region: WDL Anterior Thigh Region: WDL Posterior Calf Region: WDL  Edema: not present   Height: Ht Readings from Last 1 Encounters:  10/28/14 5\' 4"  (1.626 m)    Weight: Wt Readings from Last 1 Encounters:  10/28/14 179 lb 0.2 oz (81.2 kg)    Ideal Body Weight: 54.5 kg   % Ideal Body Weight: 149%  Wt Readings from Last 10 Encounters:  10/28/14 179 lb 0.2 oz (81.2 kg)    Usual Body Weight: 180 lb   % Usual  Body Weight: 100%  BMI:  Body mass index is 30.71 kg/(m^2).  Estimated Nutritional Needs: Kcal: 1500-1700 Protein: 75-90 grams Fluid: > 1.5 L/day  Skin: venous statis ulcers and stage II on toe  Diet Order: Diet NPO time specified  EDUCATION NEEDS: -No education needs identified at this time   Intake/Output Summary (Last 24 hours) at 10/30/14 1415 Last data filed at 10/30/14 1000  Gross per 24 hour  Intake   1000 ml  Output      0 ml  Net   1000 ml    Last BM: PTA   Labs:   Recent Labs Lab 10/28/14 1943 10/28/14 1952  NA 141 139  K 4.3 4.1  CL 101 102  CO2 24  --   BUN 25* 27*  CREATININE 0.93 1.00  CALCIUM 10.2  --   GLUCOSE 161* 167*    CBG (last 3)   Recent Labs  10/28/14 2124 10/29/14 0003 10/29/14 0520  GLUCAP 180* 166* 156*    Scheduled Meds: . antiseptic oral rinse  7 mL Mouth Rinse q12n4p  . chlorhexidine  15 mL Mouth Rinse BID  . Chlorhexidine Gluconate Cloth  6 each Topical Q0600  . enoxaparin  40 mg Subcutaneous q morning - 10a  . hydrocerin   Topical BID  . mupirocin ointment  1 application Nasal BID  . pantoprazole (PROTONIX) IV  40 mg Intravenous QHS  . senna-docusate  1 tablet Oral BID    Continuous Infusions: .  sodium chloride 50 mL/hr at 10/30/14 0515  . niCARDipine      Past Medical History  Diagnosis Date  . Hypertension   . Diabetes mellitus without complication     History reviewed. No pertinent past surgical history.  Kendell BaneHeather Bernetha Anschutz RD, LDN, CNSC [phone] Pager [phone] After Hours Pager

## 2014-10-30 NOTE — Evaluation (Signed)
Occupational Therapy Evaluation Patient Details Name: Sandy Harris MRN: [id number] DOB: 06/27/1924 Today's Date: 10/30/2014    History of Present Illness Pt admitted with R side weakness and slurred speech due to L thalamic CVA.  PMH:  DM, HTN, chronic R knee pain requiring walker use.   Clinical Impression   Prior to admission, pt lived alone with intermittent assist of her family and medical alert button for safety.  She was modified independent in self care, light meal prep, and light housekeeping.  She primarily used a RW for mobility as she has a painful R knee.  Pt now presents with dense R hemiplegia with increased tone, L gaze preference, poorly intelligible speech and poor balance.  She requires +2 assist for bed level mobility.  Pt has a supportive family.  Will benefit from intense rehab.  Will follow acutely.    Follow Up Recommendations  Supervision/Assistance - 24 hour;CIR    Equipment Recommendations  3 in 1 bedside comode;Wheelchair (measurements OT);Wheelchair cushion (measurements OT);Hospital bed    Recommendations for Other Services Rehab consult     Precautions / Restrictions Precautions Precautions: Fall Precaution Comments: dense R hemiplegia Restrictions Weight Bearing Restrictions: No      Mobility Bed Mobility Overal bed mobility: +2 for physical assistance;Needs Assistance Bed Mobility: Rolling;Supine to Sit;Sit to Supine Rolling: +2 for physical assistance;Max assist   Supine to sit: +2 for physical assistance;Max assist Sit to supine: +2 for physical assistance;Max assist   General bed mobility comments: verbal and physical cues to sequence  Transfers                      Balance Overall balance assessment: Needs assistance Sitting-balance support: Feet supported Sitting balance-Leahy Scale: Poor   Postural control: Right lateral lean                                  ADL Overall ADL's : Needs  assistance/impaired Eating/Feeding: NPO   Grooming: Maximal assistance;Sitting   Upper Body Bathing: Total assistance;Sitting   Lower Body Bathing: Total assistance;Bed level;+2 for physical assistance   Upper Body Dressing : Maximal assistance;Sitting   Lower Body Dressing: Total assistance;Bed level;+2 for physical assistance                       Vision                 Additional Comments: wears glasses at baseline, L gaze preference, but turns head full range to track R   Perception     Praxis      Pertinent Vitals/Pain Pain Assessment: Faces Faces Pain Scale: No hurt Pain Intervention(s):  (sang with pt to distract)     Hand Dominance Right   Extremity/Trunk Assessment Upper Extremity Assessment Upper Extremity Assessment: RUE deficits/detail;LUE deficits/detail RUE Deficits / Details: increased flexor tone, no functional movement, +minimal shoulder subluxation RUE Sensation: decreased proprioception RUE Coordination: decreased fine motor;decreased gross motor LUE Deficits / Details: generalized weakness   Lower Extremity Assessment Lower Extremity Assessment: Defer to PT evaluation   Cervical / Trunk Assessment Cervical / Trunk Assessment: Kyphotic   Communication Communication Communication: Expressive difficulties   Cognition Arousal/Alertness: Lethargic Behavior During Therapy: Flat affect Overall Cognitive Status: Difficult to assess                     General Comments  Exercises       Shoulder Instructions      Home Living Family/patient expects to be discharged to:: Private residence Living Arrangements: Alone Available Help at Discharge: Family;Available 24 hours/day (family can arrange 24 hour care) Type of Home: House Home Access: Stairs to enter Entergy CorporationEntrance Stairs-Number of Steps: 1 Entrance Stairs-Rails: None Home Layout: One level     Bathroom Shower/Tub: Chief Strategy OfficerTub/shower unit   Bathroom Toilet: Standard      Home Equipment: Environmental consultantWalker - 2 wheels;Shower seat;Cane - single point   Additional Comments: Family willing to adapt home for accessibility.      Prior Functioning/Environment Level of Independence: Independent with assistive device(s)        Comments: used a RW primarily    OT Diagnosis: Generalized weakness;Hemiplegia dominant side;Disturbance of vision   OT Problem List: Impaired balance (sitting and/or standing);Decreased strength;Decreased cognition;Decreased safety awareness;Decreased activity tolerance;Decreased coordination;Impaired vision/perception;Decreased knowledge of use of DME or AE;Impaired sensation;Impaired tone;Obesity;Impaired UE functional use   OT Treatment/Interventions: Self-care/ADL training;Neuromuscular education;DME and/or AE instruction;Therapeutic activities;Visual/perceptual remediation/compensation;Patient/family education    OT Goals(Current goals can be found in the care plan section) Acute Rehab OT Goals OT Goal Formulation: With patient Time For Goal Achievement: 11/13/14 Potential to Achieve Goals: Good ADL Goals Pt Will Perform Grooming: with min assist;sitting (supported) Pt Will Perform Upper Body Dressing: with min assist;sitting (supported) Pt Will Transfer to Toilet: with +2 assist;with mod assist;stand pivot transfer Pt/caregiver will Perform Home Exercise Program: Increased ROM;Right Upper extremity;With Supervision (with family) Additional ADL Goal #1: Pt/family will be independent in positioning R UE and protecting from injury. Additional ADL Goal #2: Pt will perform bed mobility with +2 min assist. Additional ADL Goal #3: Pt will sit EOB x 5 minutes with min assist while participating in activity involving UE use.  OT Frequency: Min 3X/week   Barriers to D/C:            Co-evaluation PT/OT/SLP Co-Evaluation/Treatment: Yes Reason for Co-Treatment: Necessary to address cognition/behavior during functional activity   OT goals  addressed during session: ADL's and self-care      End of Session    Activity Tolerance: Patient limited by fatigue Patient left: in bed;with call bell/phone within reach   Time: [id number] OT Time Calculation (min): 41 min Charges:  OT General Charges $OT Visit: 1 Procedure OT Evaluation $Initial OT Evaluation Tier I: 1 Procedure OT Treatments $Neuromuscular Re-education: 8-22 mins G-Codes:    Evern BioMayberry, Ferol Laiche Lynn 10/30/2014, 2:21 PM  [phone]

## 2014-10-30 NOTE — Evaluation (Signed)
Speech Language Pathology Evaluation Patient Details Name: Sandy Harris Waid MRN: [id number] DOB: 1923-12-05 Today's Date: 10/30/2014 Time: [id number] SLP Time Calculation (min) (ACUTE ONLY): 37 min  Problem List:  Patient Active Problem List   Diagnosis Date Noted  . CVA (cerebral infarction)   . ICH (intracerebral hemorrhage) 10/28/2014   Past Medical History:  Past Medical History  Diagnosis Date  . Hypertension   . Diabetes mellitus without complication    Past Surgical History: History reviewed. No pertinent past surgical history. HPI:  Sandy Harris Kinsel is an 80 y.o. female with a history of hypertension and diabetes who presents with right-sided weakness. CT Head shows left thalamic hemorrhage and midline shift.    Assessment / Plan / Recommendation Clinical Impression  Pt has a profound dysarthria, with limited intelligibility at the word and short phrase level. Speech is impacted by decreased breath support and imprecise articulation from right-sided weakness, with minimal increase in intelligibility despite multimodal cueing from SLP for use of compensatory strategies. Attempted use of a communication board, however patient was unable to locate items on the board. Pt does answer basic and mildly complex yes/no questions with high accuracy, and follows one-step commands with Min cues from therapist. Basic comprehension appears to be a strength, although additional assessment of cognitive-linguistic skills will need to be addressed in future sessions as pt's communication progresses.     SLP Assessment  Patient needs continued Speech Lanaguage Pathology Services    Follow Up Recommendations  Inpatient Rehab;Skilled Nursing facility;24 hour supervision/assistance (pending PT/OT evaluations)    Frequency and Duration min 2x/week  2 weeks   Pertinent Vitals/Pain Pain Assessment: Faces Faces Pain Scale: No hurt Pain Intervention(s):  (sang with pt to distract)   SLP Goals   Progression toward goals: Progressing toward goals Patient/Family Stated Goal: none stated Potential to Achieve Goals (ACUTE ONLY): Good Potential Considerations (ACUTE ONLY): Severity of impairments  SLP Evaluation Prior Functioning  Type of Home: House Available Help at Discharge: Family;Available 24 hours/day (family can arrange 24 hour care)   Cognition  Overall Cognitive Status: Difficult to assess Arousal/Alertness: Awake/alert Orientation Level: Oriented to person Attention: Sustained Sustained Attention: Impaired Sustained Attention Impairment: Functional basic Memory: Impaired Memory Impairment: Decreased recall of new information Awareness: Impaired Awareness Impairment: Emergent impairment    Comprehension  Auditory Comprehension Overall Auditory Comprehension: Impaired Yes/No Questions: Impaired Basic Biographical Questions: 76-100% accurate Basic Immediate Environment Questions: 75-100% accurate Complex Questions: 75-100% accurate Paragraph Comprehension (via yes/no questions): 51-75% accurate Commands: Impaired One Step Basic Commands: 75-100% accurate Conversation: Simple Visual Recognition/Discrimination Discrimination: Not tested Reading Comprehension Reading Status: Not tested    Expression Expression Primary Mode of Expression: Verbal Verbal Expression Overall Verbal Expression: Other (comment) (difficult to assess due to dysarthria) Written Expression Dominant Hand: Right Written Expression: Not tested   Oral / Motor Oral Motor/Sensory Function Overall Oral Motor/Sensory Function: Impaired Labial ROM: Reduced right Labial Symmetry: Abnormal symmetry right Labial Strength: Reduced Labial Sensation: Reduced Lingual ROM: Reduced right;Reduced left Lingual Symmetry: Within Functional Limits Lingual Strength: Reduced Facial ROM: Reduced right Facial Symmetry: Right droop Facial Strength: Reduced Motor Speech Overall Motor Speech:  Impaired Respiration: Impaired Level of Impairment: Phrase Phonation: Low vocal intensity Articulation: Impaired Level of Impairment: Word Intelligibility: Intelligibility reduced Word: 0-24% accurate Phrase: 0-24% accurate   GO      Maxcine HamLaura Paiewonsky, M.A. CCC-SLP [phone]  Maxcine Hamaiewonsky, Tarin Johndrow 10/30/2014, 2:41 PM

## 2014-10-30 NOTE — Progress Notes (Signed)
OT Cancellation Note  Patient Details Name: Sandy Harris MRN: [id number] DOB: Apr 13, 1924   Cancelled Treatment:    Reason Eval/Treat Not Completed: Other (comment). Pt continues on bedrest.  Evette GeorgesLeonard, Josemaria Brining Eva [phone] 10/30/2014, 7:44 AM

## 2014-10-30 NOTE — Progress Notes (Signed)
Stroke Team Progress Note  HISTORY Sandy Harris is a 80 y.o. female with a history of hypertension and diabetes who presents with right-sided weakness. She was last seen normal around 4:30 PM, and then when her sister try to check on her she was unable to get a hold of her. EMS had to obtain access to the residence and found the patient collapsed on the floor. She had emesis at that time. She is awake and the few things I am able to understand seem to make sense, but she is extraordinarily dysarthric. She has markedly venous stasis changes with ulcerations bilaterally. There is some erythema, the family states that this is long-standing for months and the patient denies any pain.  LKW: 4:30 PM tpa given?: no, hemorrhage Patient was not administered TPA secondary to  Brain hemorrhage. She was admitted to the neuro ICU for further evaluation and treatment.  SUBJECTIVE Her  Family  Is not at the bedside.   Overall she feels her condition is unchanged. Her blood pressure has remained well controlled. There have been no significant neurological changes overnight. Follow-up CT scan  12/10/15showed slight increase in hematoma size and left-to-right midline shift but without hydrocephalus  OBJECTIVE Most recent Vital Signs: Filed Vitals:   10/30/14 0900 10/30/14 1000 10/30/14 1145 10/30/14 1458  BP: 150/66 165/83 182/54 159/70  Pulse: 71 74 74 72  Temp:   98.4 F (36.9 C) 98.3 F (36.8 C)  TempSrc:   Oral Oral  Resp: 11 17 17    Height:      Weight:      SpO2: 95% 95% 94% 98%   CBG (last 3)   Recent Labs  10/28/14 2124 10/29/14 0003 10/29/14 0520  GLUCAP 180* 166* 156*    IV Fluid Intake:   . sodium chloride 50 mL/hr at 10/30/14 0515    MEDICATIONS  . antiseptic oral rinse  7 mL Mouth Rinse q12n4p  . chlorhexidine  15 mL Mouth Rinse BID  . Chlorhexidine Gluconate Cloth  6 each Topical Q0600  . enoxaparin  40 mg Subcutaneous q morning - 10a  . hydrocerin   Topical BID  .  mupirocin ointment  1 application Nasal BID  . pantoprazole (PROTONIX) IV  40 mg Intravenous QHS  . senna-docusate  1 tablet Oral BID   PRN:  acetaminophen **OR** acetaminophen, labetalol  Diet:  Diet NPO time specified   Activity:  Bedrest    DVT Prophylaxis:  SCDs CLINICALLY SIGNIFICANT STUDIES Basic Metabolic Panel:   Recent Labs Lab 10/28/14 1943 10/28/14 1952  NA 141 139  K 4.3 4.1  CL 101 102  CO2 24  --   GLUCOSE 161* 167*  BUN 25* 27*  CREATININE 0.93 1.00  CALCIUM 10.2  --    Liver Function Tests:   Recent Labs Lab 10/28/14 1943  AST 13  ALT 9  ALKPHOS 69  BILITOT 0.3  PROT 7.3  ALBUMIN 3.6   CBC:   Recent Labs Lab 10/28/14 1943 10/28/14 1952  WBC 11.1*  --   NEUTROABS 8.3*  --   HGB 11.5* 13.3  HCT 36.1 39.0  MCV 86.4  --   PLT 290  --    Coagulation:   Recent Labs Lab 10/28/14 1943  LABPROT 15.0  INR 1.16   Cardiac Enzymes: No results for input(s): CKTOTAL, CKMB, CKMBINDEX, TROPONINI in the last 168 hours. Urinalysis: No results for input(s): COLORURINE, LABSPEC, PHURINE, GLUCOSEU, HGBUR, BILIRUBINUR, KETONESUR, PROTEINUR, UROBILINOGEN, NITRITE, LEUKOCYTESUR in the last 168  hours.  Invalid input(s): APPERANCEUR Lipid PanelNo results found for: CHOL, TRIG, HDL, CHOLHDL, VLDL, LDLCALC HgbA1C No results found for: HGBA1C  Urine Drug Screen:  No results found for: LABOPIA, COCAINSCRNUR, LABBENZ, AMPHETMU, THCU, LABBARB  Alcohol Level: No results for input(s): ETH in the last 168 hours.  Ct Head Wo Contrast  10/29/2014   CLINICAL DATA:  80 year old diabetic hypertensive female with facial droop and mental status changes with intracranial hemorrhage. Subsequent encounter.  EXAM: CT HEAD WITHOUT CONTRAST  TECHNIQUE: Contiguous axial images were obtained from the base of the skull through the vertex without intravenous contrast.  COMPARISON:  10/28/2014 CT.  FINDINGS: Increase in size of left thalamic hemorrhage now measuring 2.3 x 1.8 cm versus  prior 1.7 x 1.6 cm. Increase amount of surrounding vasogenic edema. Slight increased mass effect upon the left lateral ventricle with 5.4 mm midline shift versus prior 3.3 mm.  Small amount of blood is seen within the deep dependent aspect of the left lateral ventricle.  This constellation of findings may represent a hypertensive hemorrhage. Underlying parenchyma can be evaluated as this clears.  Asymmetric appearance of extra-axial spaces greater on the left. This may represent combination of subdural hygroma in addition to underlying atrophy.  Small vessel disease type changes. Remote infarct superior right lenticular nucleus extending to posterior right coronal radiata.  No intracranial mass lesion seen separate from above described findings.  No calvarial abnormality.  IMPRESSION: Increase in size of left thalamic hemorrhage now measuring 2.3 x 1.8 cm versus prior 1.7 x 1.6 cm. Increase amount of surrounding vasogenic edema. Slight increased mass effect upon the left lateral ventricle with 5.4 mm midline shift versus prior 3.3 mm.  Small amount of blood is seen within the deep dependent aspect of the left lateral ventricle.  Remainder of findings without significant change as noted above.  These results will be called to the ordering clinician or representative by the Radiologist Assistant, and communication documented in the PACS or zVision Dashboard.   Electronically Signed   By: Bridgett Larsson M.D.   On: 10/29/2014 12:56   Ct Head (brain) Wo Contrast  10/28/2014   CLINICAL DATA:  Initial encounter for new onset facial droop and mental status changes.  EXAM: CT HEAD WITHOUT CONTRAST  TECHNIQUE: Contiguous axial images were obtained from the base of the skull through the vertex without intravenous contrast.  COMPARISON:  None.  FINDINGS: 1.6 cm intra-axial, intraparenchymal hemorrhage is identified in the left thalamus. There is a small amount of associated edema.  CSF density left sided supratentorial  extra-axial fluid collections suggest the presence of a left-sided chronic subdural hematoma. There is 3-4 mm of left to right midline shift which may be related to the hemorrhage and/or the chronic left extra-axial fluid collection.  Diffuse loss of parenchymal volume is consistent with atrophy. Lacunar infarct is identified in the right basal ganglia and corona radiata.  The visualized paranasal sinuses and mastoid air cells are clear.  IMPRESSION: Acute left-sided of the laminae can't hemorrhage with hematoma measuring about 1.6 cm.  Chronic left-sided extra-axial fluid collection measuring about 8 mm in thickness.  Three-4 mm left-to-right midline shift secondary to the left thalamic hemorrhage and/ or left extra-axial fluid collection.  Critical Value/emergent results were called by telephone at the time of interpretation on 10/28/2014 at 8:04 pm to Dr. Amada Jupiter, who verbally acknowledged these results.   Electronically Signed   By: Kennith Center M.D.   On: 10/28/2014 20:04  MRI of the brain  pending  MRA of the brain  pending Carotid Doppler  Not ordered 2D Echocardiogram  Not ordered  CXR  pending  EKG   85 beats/min Sinus rhythm  Therapy Recommendations pending  Physical Exam   Elderly caucasian lady not in distress.    Afebrile. Head is nontraumatic. Neck is supple without bruit.  . Cardiac exam no murmur or gallop. Lungs are clear to auscultation. Distal pulses are well felt. Neurological Exam : Awake alert Marked dysarthria and can speak a few words and short sentences and can be understood with great difficulty. Follows only occasional commands. Left gaze preference and looked partially to the right but not all the way. Blinks to threat on the left but not the right. Fundi were not visualized. Moderate right lower facial weakness. Tongue midline. Dense right hemiplegia with hypotonia in the right upper extremity. Marked paratonia in the right lower extremity with some tenderness  around the right knee which is chronic per the daughter. Purposeful antigravity strength on the left side. Right plantar upgoing left downgoing. Gait was not tested  ASSESSMENT Sandy Harris is a 80 y.o. female presenting with left thalamic hemorrhage likely hypertensive etiology with mild cytotoxic edema and mass effect on lateral ventricle with left to right brain shift but no hydrocephalus.    .  On no antiplatelet agents prior to admission. Now on no antiplatelet agentfor secondary stroke prevention. Patient with resultant dysarthria, mild aphasia, right facial droop and right hemiparesis. Stroke work up underway.   HT  Diabetes -mild hyperglycemia CBG 167   Hospital day # 2  TREATMENT/PLAN Continue close neurological monitoring and strict control of hypertension with blood pressure goal below 160 systolic for 24 hours and then 180 systolic thereafter. Repeat CT scan of the head does show slight increase in hematoma size and mass effect and midline shift but the patient is not a good candidate for long-term ventilation and aggressive measures. Patient is DO NOT RESUSCITATE but chemical code as per daughter. I had a long discussion with the daughter at the bedside and subsequently with her sister and son who came later as well. We will transfer her to the neurological floor today and mobilize out of bed and await rehabilitation consult  I have personally examined this patient, reviewed notes, independently viewed imaging studies, participated in medical decision making and plan of care. I have made any additions or clarifications directly to the above note. Delia HeadyPramod Sethi, MD Medical Director [facility] Pager: [phone] 10/30/2014 3:53 PM        To contact Stroke Continuity provider, please refer to [email or web address]. After hours, contact General Neurology

## 2014-10-30 NOTE — Evaluation (Deleted)
Occupational Therapy Evaluation Patient Details Name: Golden PopMattie H Belgrave MRN: [id number] DOB: 07/04/1924 Today's Date: 10/30/2014    History of Present Illness Pt admitted after she was found on the floor at her ALF.  CT revealed R occipital intracerebral hemorrhage. PMH:  advanced dementia, TIAs, HTN   Clinical Impression   Unclear what pt's level of function was prior to admission, but likely dependent given her advanced dementia.  Pt requiring total assist for all care today and +2 assist for all mobility.  Will defer further OT to SNF's discretion.    Follow Up Recommendations  SNF;Supervision/Assistance - 24 hour    Equipment Recommendations       Recommendations for Other Services       Precautions / Restrictions Precautions Precautions: Fall Precaution Comments: pt is incontinent Restrictions Weight Bearing Restrictions: No      Mobility Bed Mobility Overal bed mobility: +2 for physical assistance;Needs Assistance Bed Mobility: Rolling;Supine to Sit;Sit to Supine Rolling: +2 for physical assistance;Min assist   Supine to sit: +2 for physical assistance;Min assist Sit to supine: +2 for physical assistance;Min assist      Transfers Overall transfer level: Needs assistance   Transfers: Sit to/from Stand Sit to Stand: +2 physical assistance;Min assist              Balance Overall balance assessment: Needs assistance Sitting-balance support: Feet supported Sitting balance-Leahy Scale: Fair     Standing balance support: Bilateral upper extremity supported Standing balance-Leahy Scale: Poor Standing balance comment: stood x 5 minutes for clean up                            ADL Overall ADL's : At baseline (dependent, except for feeding--set up/supervision)                                             Vision                     Perception     Praxis      Pertinent Vitals/Pain Pain Assessment: Faces Faces Pain  Scale: Hurts little more Pain Location: periarea with cleaning Pain Descriptors / Indicators: Grimacing;Crying Pain Intervention(s): Monitored during session;Repositioned (sang with pt to distract)     Hand Dominance     Extremity/Trunk Assessment Upper Extremity Assessment Upper Extremity Assessment: Overall WFL for tasks assessed   Lower Extremity Assessment Lower Extremity Assessment: Defer to PT evaluation       Communication Communication Communication: No difficulties   Cognition Arousal/Alertness: Awake/alert Behavior During Therapy: Impulsive;Restless Overall Cognitive Status: History of cognitive impairments - at baseline                     General Comments       Exercises       Shoulder Instructions      Home Living Family/patient expects to be discharged to:: Assisted living Living Arrangements: Non-relatives/Friends                                      Prior Functioning/Environment          Comments: No family to determine baseline, likely dependent.    OT Diagnosis:     OT Problem List: Impaired  balance (sitting and/or standing);Decreased strength;Decreased cognition;Decreased safety awareness   OT Treatment/Interventions:      OT Goals(Current goals can be found in the care plan section)    OT Frequency:     Barriers to D/C:            Co-evaluation PT/OT/SLP Co-Evaluation/Treatment: Yes Reason for Co-Treatment: Necessary to address cognition/behavior during functional activity   OT goals addressed during session: ADL's and self-care      End of Session Equipment Utilized During Treatment: Gait belt  Activity Tolerance: Patient tolerated treatment well Patient left: in bed;with call bell/phone within reach;with nursing/sitter in room   Time: 7829-56211114-1144 OT Time Calculation (min): 30 min Charges:  OT General Charges $OT Visit: 1 Procedure OT Evaluation $Initial OT Evaluation Tier I: 1 Procedure OT  Treatments $Self Care/Home Management : 8-22 mins G-Codes:    Evern BioMayberry, Alencia Gordon Lynn 10/30/2014, 12:15 PM  [phone]

## 2014-10-30 NOTE — Progress Notes (Signed)
Rehab Admissions Coordinator Note:  Patient was screened by Clois DupesBoyette, Noha Karasik Godwin for appropriateness for an Inpatient Acute Rehab Consult per therapy evaluations. At this time, we are recommending Inpatient Rehab consult.  Clois DupesBoyette, Kataleyah Carducci Godwin 10/30/2014, 2:52 PM  I can be reached at [phone].

## 2014-10-30 NOTE — Evaluation (Signed)
Physical Therapy Evaluation Patient Details Name: Sandy Harris MRN: [id number] DOB: [birth year] Today's Date: Harris   History of Present Illness  Pt admitted with R side weakness and slurred speech due to L thalamic CVA.  PMH:  DM, HTN, chronic R knee pain requiring walker use.  Clinical Impression  Pt with dense R hemiparesis, dysarthria, and L sided gaze preference.  Pt able to participate in mobility and follows all directions well.  Per family pt had been independent PTA and did everything for herself except driving.  At this time feel pt would benefit from CIR at D/C to decrease burden of care prior to returning to home with family support.  Will continue to follow.      Follow Up Recommendations CIR    Equipment Recommendations   (TBD)    Recommendations for Other Services Rehab consult     Precautions / Restrictions Precautions Precautions: Fall Precaution Comments: dense R hemiplegia Restrictions Weight Bearing Restrictions: No      Mobility  Bed Mobility Overal bed mobility: +2 for physical assistance;Needs Assistance Bed Mobility: Rolling;Supine to Sit;Sit to Supine Rolling: +2 for physical assistance;Max assist   Supine to sit: +2 for physical assistance;Max assist Sit to supine: +2 for physical assistance;Max assist   General bed mobility comments: verbal and physical cues to sequence  Transfers                    Ambulation/Gait                Stairs            Wheelchair Mobility    Modified Rankin (Stroke Patients Only)       Balance Overall balance assessment: Needs assistance Sitting-balance support: Feet supported Sitting balance-Leahy Scale: Poor Sitting balance - Comments: pt able to work on propping on either elbow and bringing trunk back to midline.  Worked on positioning and stretching into trunk extension.   Postural control: Right lateral lean                                   Pertinent  Vitals/Pain Pain Assessment: Faces Faces Pain Scale: No hurt Pain Intervention(s):  (sang with pt to distract)    Home Living Family/patient expects to be discharged to:: Private residence Living Arrangements: Alone Available Help at Discharge: Family;Available 24 hours/day (family can arrange 24 hour care) Type of Home: House Home Access: Stairs to enter Entrance Stairs-Rails: None Entrance Stairs-Number of Steps: 1 Home Layout: One level Home Equipment: Walker - 2 wheels;Shower seat;Cane - single point Additional Comments: Family willing to adapt home for accessibility.    Prior Function Level of Independence: Independent with assistive device(s)         Comments: used a RW primarily     Hand Dominance   Dominant Hand: Right    Extremity/Trunk Assessment   Upper Extremity Assessment: Defer to OT evaluation RUE Deficits / Details: increased flexor tone, no functional movement, +minimal shoulder subluxation   RUE Sensation: decreased proprioception LUE Deficits / Details: generalized weakness   Lower Extremity Assessment: RLE deficits/detail RLE Deficits / Details: Increased flexor tone noted and only muscle contraction at hip, no active movement at knee/ankle    Cervical / Trunk Assessment: Kyphotic  Communication   Communication: Expressive difficulties  Cognition Arousal/Alertness: Lethargic Behavior During Therapy: Flat affect Overall Cognitive Status: Difficult to assess  General Comments      Exercises        Assessment/Plan    PT Assessment Patient needs continued PT services  PT Diagnosis Difficulty walking   PT Problem List Decreased strength;Decreased range of motion;Decreased activity tolerance;Decreased balance;Decreased mobility;Decreased coordination;Decreased knowledge of use of DME;Impaired tone  PT Treatment Interventions DME instruction;Gait training;Functional mobility training;Therapeutic  activities;Therapeutic exercise;Balance training;Neuromuscular re-education;Patient/family education   PT Goals (Current goals can be found in the Care Plan section) Acute Rehab PT Goals Patient Stated Goal: Per famiyl for pt to return to home.   PT Goal Formulation: With patient/family Time For Goal Achievement: 11/13/14 Potential to Achieve Goals: Good    Frequency Min 4X/week   Barriers to discharge        Co-evaluation PT/OT/SLP Co-Evaluation/Treatment: Yes Reason for Co-Treatment: Complexity of the patient's impairments (multi-system involvement) PT goals addressed during session: Mobility/safety with mobility;Balance;Strengthening/ROM OT goals addressed during session: ADL's and self-care       End of Session   Activity Tolerance: Patient limited by fatigue Patient left: in bed;with call bell/phone within reach;with bed alarm set Nurse Communication: Mobility status         Time: [id number] PT Time Calculation (min) (ACUTE ONLY): 33 min   Charges:   PT Evaluation $Initial PT Evaluation Tier I: 1 Procedure PT Treatments $Therapeutic Activity: 8-22 mins   PT G CodesSunny Schlein:          Sandy Harris, Sandy Harris, Sandy Harris

## 2014-10-30 NOTE — Progress Notes (Signed)
Received consult for SNF for this patient. This is a CSW function and I have notified her of the need.  Await therapy evals as they will be needed to support the level of care recommended for patient at d/c.   Carlyle LipaMichelle Stacy Sailer, RN BSN MHA CCM  Case Manager, Trauma Service/Unit 2M [phone]

## 2014-10-30 NOTE — Progress Notes (Signed)
PT Cancellation Note  Patient Details Name: Golden PopMattie H Trego MRN: [id number] DOB: [birth year]   Cancelled Treatment:    Reason Eval/Treat Not Completed: Patient not medically ready.  Pt continues to be on bedrest.  Please update activity orders if pt is appropriate for mobility.     Sugey Trevathan, Alison MurrayMegan F 10/30/2014, 7:54 AM

## 2014-10-30 NOTE — Progress Notes (Signed)
Speech Language Pathology Treatment: Dysphagia  Patient Details Name: Golden PopMattie H Husak MRN: [id number] DOB: 05-14-1924 Today's Date: 10/30/2014 Time: 9528-41321149-1159 SLP Time Calculation (min) (ACUTE ONLY): 10 min  Assessment / Plan / Recommendation Clinical Impression  Pt shows increased labial and lingual movements as compared to initial evaluation. SLP provided Max multimodal cueing for labial opening and closure to accept boluses, with immediate cough noted with thin liquids and ice chips. Anterior spillage is observed with all consistencies tested, and purees are pocketed in the right buccal cavity, requiring Mod cues to clear with lingual sweep. Pt consumed a few bites before declining further trials, and delayed coughing was noted, suspicious for residuals. SLP provided education to patient and family regarding the option of pursuing objective testing to determine least restrictive diet. Pt and family would prefer to defer testing today and remain NPO until reassessment can be completed bedside tomorrow. Will continue to follow for readiness and to determine if objective testing is warranted, given clinical presentation and family wishes.   HPI HPI: Golden PopMattie H Slater is an 80 y.o. female with a history of hypertension and diabetes who presents with right-sided weakness. CT Head shows left thalamic hemorrhage and midline shift.    Pertinent Vitals Pain Assessment: Faces Faces Pain Scale: No hurt Pain Location: periarea with cleaning Pain Descriptors / Indicators: Grimacing;Crying Pain Intervention(s): Monitored during session;Repositioned (sang with pt to distract)  SLP Plan  Continue with current plan of care    Recommendations Diet recommendations: NPO Medication Administration: Via alternative means              Oral Care Recommendations: Oral care Q4 per protocol Follow up Recommendations: Inpatient Rehab;Skilled Nursing facility;24 hour supervision/assistance (pending PT/OT  evaluations) Plan: Continue with current plan of care    GO      Maxcine HamLaura Paiewonsky, M.A. CCC-SLP [phone]  Maxcine Hamaiewonsky, Travontae Freiberger 10/30/2014, 2:07 PM

## 2014-10-31 ENCOUNTER — Inpatient Hospital Stay (HOSPITAL_COMMUNITY): Payer: Medicare HMO

## 2014-10-31 NOTE — Procedures (Addendum)
Objective Swallowing Evaluation: Modified Barium Swallowing Study  Patient Details  Name: Sandy Harris Rolf MRN: [id number] Date of Birth: 09/26/1924  Today's Date: 10/31/2014 Time: [id number] SLP Time Calculation (min) (ACUTE ONLY): 40 min  Past Medical History:  Past Medical History  Diagnosis Date  . Hypertension   . Diabetes mellitus without complication    Past Surgical History: History reviewed. No pertinent past surgical history. HPI:  Sandy Harris Giglia is an 80 y.o. female with a history of hypertension and diabetes who presents with right-sided weakness. CT Head shows left thalamic hemorrhage and midline shift.      Assessment / Plan / Recommendation Clinical Impression  Dysphagia Diagnosis: Severe oral phase dysphagia;Mild pharyngeal phase dysphagia Clinical impression: Pt demonstrates a severe oral phase/ mild pharyngeal phase dysphagia characterized by significantly decreased oral manipulation/ oral transit; bolus prematurely spills to the level of the pyriform sinuses resulting in pt aspirating thin and nectar-thick liquids by straw. Pt sensed aspiration and attempted to clear- small amount remained below the level of the vocal folds. Pt able to consume honey-thick liquid by straw without penetration/ aspiration. Significant amounts of oral residuals required suctioning following all trials. Recommend initiating puree diet/ honey-thick liquids by straw, meds crushed in puree. Full supervision required during meals to cue pt to take small bites/ sips, have oral suction available, cue pt to swallow multiple times and check oral cavity following every bite/ sip. Pt will be a laborious feeder- concerned for adequate nutrition and hydration given findings of MBS. ST will continue to follow closely for diet tolerance/ advancement.    Treatment Recommendation  Therapy as outlined in treatment plan below    Diet Recommendation Dysphagia 1 (Puree);Honey-thick liquid   Liquid Administration  via: Straw Medication Administration: Crushed with puree (alt means if crushed is too difficult) Supervision: Full supervision/cueing for compensatory strategies Compensations: Slow rate;Small sips/bites;Check for pocketing;Check for anterior loss;Multiple dry swallows after each bite/sip Postural Changes and/or Swallow Maneuvers: Seated upright 90 degrees;Upright 30-60 min after meal    Other  Recommendations Oral Care Recommendations: Oral care BID Other Recommendations: Have oral suction available;Order thickener from pharmacy   Follow Up Recommendations  Inpatient Rehab;Skilled Nursing facility;24 hour supervision/assistance    Frequency and Duration min 2x/week  2 weeks   Pertinent Vitals/Pain n/a    SLP Swallow Goals     General HPI: Sandy Harris Trower is an 80 y.o. female with a history of hypertension and diabetes who presents with right-sided weakness. CT Head shows left thalamic hemorrhage and midline shift.  Type of Study: Modified Barium Swallowing Study Reason for Referral: Objectively evaluate swallowing function Diet Prior to this Study: NPO Temperature Spikes Noted: No Respiratory Status: Room air History of Recent Intubation: No Behavior/Cognition: Alert;Cooperative;Requires cueing Oral Cavity - Dentition: Edentulous Patient Positioning: Upright in chair Baseline Vocal Quality: Low vocal intensity Anatomy: Within functional limits Pharyngeal Secretions: Not observed secondary MBS    Reason for Referral Objectively evaluate swallowing function   Oral Phase Oral Preparation/Oral Phase Oral Phase: Impaired Oral - Honey Oral - Honey Cup: Left anterior bolus loss;Right anterior bolus loss;Reduced posterior propulsion;Delayed oral transit;Lingual/palatal residue;Weak lingual manipulation Oral - Nectar Oral - Nectar Cup: Left anterior bolus loss;Right anterior bolus loss;Weak lingual manipulation;Reduced posterior propulsion;Lingual/palatal residue;Delayed oral  transit Oral - Nectar Straw: Left anterior bolus loss;Right anterior bolus loss;Weak lingual manipulation;Reduced posterior propulsion;Lingual/palatal residue;Delayed oral transit Oral - Thin Oral - Thin Cup: Left anterior bolus loss;Right anterior bolus loss;Weak lingual manipulation;Reduced posterior propulsion;Delayed oral transit;Lingual/palatal residue;Right pocketing in  lateral sulci;Left pocketing in lateral sulci;Pocketing in anterior sulcus Oral - Thin Straw: Left anterior bolus loss;Right anterior bolus loss;Reduced posterior propulsion;Lingual/palatal residue;Delayed oral transit;Weak lingual manipulation Oral - Solids Oral - Puree: Lingual/palatal residue   Pharyngeal Phase Pharyngeal Phase Pharyngeal Phase: Impaired Pharyngeal - Honey Pharyngeal - Honey Cup: Delayed swallow initiation;Pharyngeal residue - valleculae Pharyngeal - Nectar Pharyngeal - Nectar Cup: Delayed swallow initiation;Premature spillage to pyriform sinuses Pharyngeal - Nectar Straw: Delayed swallow initiation;Premature spillage to pyriform sinuses;Penetration/Aspiration before swallow;Moderate aspiration;Pharyngeal residue - valleculae;Inter-arytenoid space residue;Lateral channel residue Penetration/Aspiration details (nectar straw): Material enters airway, passes BELOW cords and not ejected out despite cough attempt by patient Pharyngeal - Thin Pharyngeal - Thin Cup: Delayed swallow initiation;Premature spillage to pyriform sinuses Pharyngeal - Thin Straw: Delayed swallow initiation;Premature spillage to pyriform sinuses;Penetration/Aspiration before swallow;Moderate aspiration;Pharyngeal residue - valleculae;Pharyngeal residue - pyriform sinuses;Inter-arytenoid space residue;Lateral channel residue Penetration/Aspiration details (thin straw): Material enters airway, passes BELOW cords and not ejected out despite cough attempt by patient  Cervical Esophageal Phase    GO    Cervical Esophageal  Phase Cervical Esophageal Phase: Peter MiniumWFL         Alakai Macbride K, MA, CCC-SLP 10/31/2014, 3:07 PM

## 2014-10-31 NOTE — Clinical Documentation Improvement (Signed)
  Patient currently receiving Protonix 40 mg IV daily.  Patient was on Prilosec prior to admission per ED physician note.  Please provide a diagnosis or prevention strategy that requires the administration of Protonix.   Thank You, Jerral Ralphathy R Betti Goodenow ,RN Clinical Documentation Specialist:  [phone] [facility]

## 2014-10-31 NOTE — Progress Notes (Signed)
Stroke Team Progress Note  HISTORY Sandy Harris is a 80 y.o. female with a history of hypertension and diabetes who presents with right-sided weakness. She was last seen normal around 4:30 PM, and then when her sister try to check on her she was unable to get a hold of her. EMS had to obtain access to the residence and found the patient collapsed on the floor. She had emesis at that time. She is awake and the few things I am able to understand seem to make sense, but she is extraordinarily dysarthric. She has markedly venous stasis changes with ulcerations bilaterally. There is some erythema, the family states that this is long-standing for months and the patient denies any pain.  LKW: 4:30 PM tpa given?: no, hemorrhage Patient was not administered TPA secondary to  Brain hemorrhage. She was admitted to the neuro ICU for further evaluation and treatment.  SUBJECTIVE No family present. The patient is oriented and follows commands.  OBJECTIVE Most recent Vital Signs: Filed Vitals:   10/30/14 2100 10/31/14 0136 10/31/14 0516 10/31/14 1001  BP: 154/51 153/51 171/52 180/66  Pulse: 81 78 72 82  Temp:  98.6 F (37 C) 98.2 F (36.8 C) 98.3 F (36.8 C)  TempSrc: Oral Oral Axillary Axillary  Resp: 16 16 16 18   Height:      Weight:      SpO2: 96% 98% 98% 97%   CBG (last 3)   Recent Labs  10/28/14 2124 10/29/14 0003 10/29/14 0520  GLUCAP 180* 166* 156*    IV Fluid Intake:   . sodium chloride 50 mL/hr at 10/31/14 0019    MEDICATIONS  . antiseptic oral rinse  7 mL Mouth Rinse q12n4p  . chlorhexidine  15 mL Mouth Rinse BID  . Chlorhexidine Gluconate Cloth  6 each Topical Q0600  . enoxaparin  40 mg Subcutaneous q morning - 10a  . hydrocerin   Topical BID  . mupirocin ointment  1 application Nasal BID  . pantoprazole (PROTONIX) IV  40 mg Intravenous QHS  . senna-docusate  1 tablet Oral BID   PRN:  acetaminophen **OR** acetaminophen, labetalol  Diet:  DIET - DYS 1  honey thick  liquids Activity:  Up with assistance DVT Prophylaxis:  Lovenox CLINICALLY SIGNIFICANT STUDIES Basic Metabolic Panel:   Recent Labs Lab 10/28/14 1943 10/28/14 1952  NA 141 139  K 4.3 4.1  CL 101 102  CO2 24  --   GLUCOSE 161* 167*  BUN 25* 27*  CREATININE 0.93 1.00  CALCIUM 10.2  --    Liver Function Tests:   Recent Labs Lab 10/28/14 1943  AST 13  ALT 9  ALKPHOS 69  BILITOT 0.3  PROT 7.3  ALBUMIN 3.6   CBC:   Recent Labs Lab 10/28/14 1943 10/28/14 1952  WBC 11.1*  --   NEUTROABS 8.3*  --   HGB 11.5* 13.3  HCT 36.1 39.0  MCV 86.4  --   PLT 290  --    Coagulation:   Recent Labs Lab 10/28/14 1943  LABPROT 15.0  INR 1.16   Cardiac Enzymes: No results for input(s): CKTOTAL, CKMB, CKMBINDEX, TROPONINI in the last 168 hours. Urinalysis: No results for input(s): COLORURINE, LABSPEC, PHURINE, GLUCOSEU, HGBUR, BILIRUBINUR, KETONESUR, PROTEINUR, UROBILINOGEN, NITRITE, LEUKOCYTESUR in the last 168 hours.  Invalid input(s): APPERANCEUR Lipid PanelNo results found for: CHOL, TRIG, HDL, CHOLHDL, VLDL, LDLCALC HgbA1C No results found for: HGBA1C  Urine Drug Screen:  No results found for: LABOPIA, COCAINSCRNUR, LABBENZ, AMPHETMU, THCU,  LABBARB  Alcohol Level: No results for input(s): ETH in the last 168 hours.  Dg Swallowing Func-speech Pathology 10/31/2014    Diet Recommendation Dysphagia 1 (Puree);Honey-thick liquid     Follow Up Recommendations  Inpatient Rehab;Skilled Nursing facility;24 hour  supervision/assistance    Frequency and Duration min 2x/week  2 weeks        Carotid Doppler  Not ordered 2D Echocardiogram  Not ordered  CT head without contrast 10/28/2014 Acute left-sided thalamic hemorrhage with hematoma measuring about 1.6 cm. Chronic left-sided extra-axial fluid collection measuring about 8 mm in thickness. Three-4 mm left-to-right midline shift secondary to the left thalamic hemorrhage and/ or left extra-axial fluid collection  CT head  without contrast 10/29/2014 Increase in size of left thalamic hemorrhage now measuring 2.3 x 1.8 cm versus prior 1.7 x 1.6 cm.  Increase amount of surrounding vasogenic edema.  Slight increased mass effect upon the left lateral ventricle with 5.4 mm midline shift versus prior 3.3 mm. Small amount of blood is seen within the deep dependent aspect of the left lateral ventricle.   EKG   85 beats/min Sinus rhythm  Therapy Recommendations - CIR  Physical Exam   Elderly caucasian lady not in distress.    Afebrile. Head is nontraumatic. Neck is supple without bruit.  . Cardiac exam no murmur or gallop. Lungs are clear to auscultation. Distal pulses are well felt. Neurological Exam : Awake alert Moderate dysarthria and can speak a few words and short sentences and can be understood with great difficulty. Follows only occasional commands. Left gaze preference and looked partially to the right but not all the way. Blinks to threat on the left but not the right. Fundi were not visualized. Moderate right lower facial weakness. Tongue midline. Dense right hemiplegia with hypotonia in the right upper extremity. Marked paratonia in the right lower extremity with some tenderness around the right knee which is chronic per the daughter. Purposeful antigravity strength on the left side. Right plantar upgoing left downgoing. Gait was not tested      ASSESSMENT Sandy Harris is a 80 y.o. female presenting with left thalamic hemorrhage likely hypertensive etiology with mild cytotoxic edema and mass effect on lateral ventricle with left to right brain shift but no hydrocephalus. On no antiplatelet agents prior to admission. Now on no antiplatelet agent for secondary stroke prevention secondary to hemorrhage.  Patient with resultant dysarthria, mild aphasia, right facial droop and right hemiparesis. Stroke work up underway.   Hypertension  Diabetes -mild hyperglycemia CBG 167  Elevated BUN- question  dehydration  Bilateral lower extremity wounds -  Consult from Wound Care RN - 10/29/2014 - Eucerin cream for dry skin and  Bactroban to promote healing  UTI - day #2 IV Cipro - culture pending  Dysphagia - cleared for dysphagia 1 diet with honey thick liquids  On protonix for GI prophylaxis      Hospital day # 3   Delton Seeavid Rinehuls PA-C [facility] Pager [phone] 10/31/2014, 4:18 PM   TREATMENT/PLAN Continue close neurological monitoring and strict control of hypertension with blood pressure goal below 160 systolic for 24 hours and then 180 systolic thereafter. Repeat CT scan of the head does show slight increase in hematoma size and mass effect and midline shift but the patient is not a good candidate for long-term ventilation and aggressive measures. Patient is DO NOT RESUSCITATE but chemical code as per daughter. I had a long discussion with the daughter at the bedside and  subsequently with her sister and son who came later as well. We will transfer her to the neurological floor today and mobilize out of bed and await rehabilitation consult  I have personally examined this patient, reviewed notes, independently viewed imaging studies, participated in medical decision making and plan of care. I have made any additions or clarifications directly to the above note.  Delia HeadyPramod Ammy Lienhard, MD        To contact Stroke Continuity provider, please refer to [email or web address]. After hours, contact General Neurology

## 2014-10-31 NOTE — Progress Notes (Signed)
Pt admitted on 12/9 to 3 M Neuro ICU with ICH. Transferred via stretcher to 4N24 on this date at 1300. Received report from Jonny RuizJohn, Charity fundraiserN. Pt and family members oriented to room. Vital signs recorded and patient reports no pain.

## 2014-10-31 NOTE — Progress Notes (Signed)
Speech Language Pathology Treatment: Dysphagia  Patient Details Name: Sandy Harris MRN: [id number] DOB: 1924/08/12 Today's Date: 10/31/2014 Time: 1020-1040 SLP Time Calculation (min) (ACUTE ONLY): 20 min  Assessment / Plan / Recommendation Clinical Impression  Dysphagia treatment provided today to assess for PO readiness. The pt tolerated ice chips well today with no overt s/s of aspiration. However, pt had cough/ throat clear for 2 of 4 trials of thin liquids by cup. No s/s of aspiration following puree but pt pockets significant amount on right side; attempted to clear with cues- suction required. Speech continues to be severely dysarthric but pt attempts to use strategies to increase intelligibility (speaking louder/ slower). Given these findings, the pt continues to be at high risk of aspiration and should remain NPO, meds via alternative means. Recommend MBS in order to objectively evaluate swallow function to determine safest diet/ compensatory strategies.    HPI HPI: Sandy Harris is an 80 y.o. female with a history of hypertension and diabetes who presents with right-sided weakness. CT Head shows left thalamic hemorrhage and midline shift.    Pertinent Vitals Pain Assessment: No/denies pain  SLP Plan  New goals to be determined pending instrumental study    Recommendations Diet recommendations: NPO Medication Administration: Via alternative means              Oral Care Recommendations: Oral care Q4 per protocol Follow up Recommendations: Inpatient Rehab;Skilled Nursing facility;24 hour supervision/assistance Plan: New goals to be determined pending instrumental study    GO     Rebecca Eatonleksiak, Amy K, MA, CCC-SLP 10/31/2014, 10:51 AM

## 2014-11-01 DIAGNOSIS — I619 Nontraumatic intracerebral hemorrhage, unspecified: Secondary | ICD-10-CM

## 2014-11-01 LAB — CBC
HCT: 35.3 % — ABNORMAL LOW (ref 36.0–46.0)
HEMOGLOBIN: 11.4 g/dL — AB (ref 12.0–15.0)
MCH: 27.1 pg (ref 26.0–34.0)
MCHC: 32.3 g/dL (ref 30.0–36.0)
MCV: 83.8 fL (ref 78.0–100.0)
Platelets: 283 10*3/uL (ref 150–400)
RBC: 4.21 MIL/uL (ref 3.87–5.11)
RDW: 15.7 % — AB (ref 11.5–15.5)
WBC: 10.4 10*3/uL (ref 4.0–10.5)

## 2014-11-01 LAB — URINALYSIS, ROUTINE W REFLEX MICROSCOPIC
Bilirubin Urine: NEGATIVE
Glucose, UA: NEGATIVE mg/dL
Ketones, ur: 15 mg/dL — AB
LEUKOCYTES UA: NEGATIVE
Nitrite: NEGATIVE
Protein, ur: 100 mg/dL — AB
Specific Gravity, Urine: 1.014 (ref 1.005–1.030)
UROBILINOGEN UA: 1 mg/dL (ref 0.0–1.0)
pH: 6 (ref 5.0–8.0)

## 2014-11-01 LAB — BASIC METABOLIC PANEL
ANION GAP: 15 (ref 5–15)
BUN: 12 mg/dL (ref 6–23)
CALCIUM: 9.1 mg/dL (ref 8.4–10.5)
CO2: 24 mEq/L (ref 19–32)
Chloride: 100 mEq/L (ref 96–112)
Creatinine, Ser: 0.54 mg/dL (ref 0.50–1.10)
GFR calc non Af Amer: 82 mL/min — ABNORMAL LOW (ref >90)
Glucose, Bld: 130 mg/dL — ABNORMAL HIGH (ref 70–99)
Potassium: 3 mEq/L — ABNORMAL LOW (ref 3.7–5.3)
Sodium: 139 mEq/L (ref 137–147)

## 2014-11-01 LAB — URINE MICROSCOPIC-ADD ON

## 2014-11-01 MED ORDER — METOPROLOL TARTRATE 25 MG PO TABS
25.0000 mg | ORAL_TABLET | Freq: Two times a day (BID) | ORAL | Status: DC
  Administered 2014-11-01 – 2014-11-03 (×5): 25 mg via ORAL
  Filled 2014-11-01 (×5): qty 1

## 2014-11-01 MED ORDER — PANTOPRAZOLE SODIUM 40 MG PO TBEC
40.0000 mg | DELAYED_RELEASE_TABLET | Freq: Every day | ORAL | Status: DC
  Administered 2014-11-01 – 2014-11-02 (×2): 40 mg via ORAL
  Filled 2014-11-01 (×2): qty 1

## 2014-11-01 MED ORDER — POTASSIUM CHLORIDE CRYS ER 20 MEQ PO TBCR
20.0000 meq | EXTENDED_RELEASE_TABLET | Freq: Three times a day (TID) | ORAL | Status: DC
  Administered 2014-11-01 – 2014-11-03 (×8): 20 meq via ORAL
  Filled 2014-11-01 (×8): qty 1

## 2014-11-01 MED ORDER — POTASSIUM CHLORIDE 20 MEQ PO PACK
20.0000 meq | PACK | Freq: Three times a day (TID) | ORAL | Status: DC
  Filled 2014-11-01 (×3): qty 1

## 2014-11-01 MED ORDER — SODIUM CHLORIDE 0.9 % IV SOLN
INTRAVENOUS | Status: DC
  Administered 2014-11-01 – 2014-11-03 (×4): via INTRAVENOUS

## 2014-11-01 NOTE — Progress Notes (Signed)
SLP spoke to RN re: pt tolerance of po diet today.  RN reports this am pt was too drowsy but is currently tolerating lunch meal.  SLP will continue to follow.  Donavan Burnetamara Alphus Zeck, MS [facility] SLP [phone]

## 2014-11-01 NOTE — Progress Notes (Signed)
Stroke Team Progress Note  HISTORY Sandy Harris is a 80 y.o. female with a history of hypertension and diabetes who presents with right-sided weakness. She was last seen normal around 4:30 PM, and then when her sister try to check on her she was unable to get a hold of her. EMS had to obtain access to the residence and found the patient collapsed on the floor. She had emesis at that time. She is awake and the few things I am able to understand seem to make sense, but she is extraordinarily dysarthric. She has markedly venous stasis changes with ulcerations bilaterally. There is some erythema, the family states that this is long-standing for months and the patient denies any pain.  LKW: 4:30 PM tpa given?: no, hemorrhage Patient was not administered TPA secondary to  Brain hemorrhage. She was admitted to the neuro ICU for further evaluation and treatment.  SUBJECTIVE The patient's son is at the bedside. The patient was living alone prior to admission. She is showing some improvement.    OBJECTIVE Most recent Vital Signs: Filed Vitals:   10/31/14 1001 10/31/14 2217 11/01/14 0100 11/01/14 0221  BP: 180/66 159/65  172/58  Pulse: 82 85    Temp: 98.3 F (36.8 C) 99 F (37.2 C)  98.7 F (37.1 C)  TempSrc: Axillary Oral  Axillary  Resp: 18 24 20 20   Height:      Weight:      SpO2: 97% 95%  99%   CBG (last 3)  No results for input(s): GLUCAP in the last 72 hours.  IV Fluid Intake:   . sodium chloride 50 mL/hr at 11/01/14 [id number]    MEDICATIONS  . antiseptic oral rinse  7 mL Mouth Rinse q12n4p  . chlorhexidine  15 mL Mouth Rinse BID  . Chlorhexidine Gluconate Cloth  6 each Topical Q0600  . enoxaparin  40 mg Subcutaneous q morning - 10a  . hydrocerin   Topical BID  . mupirocin ointment  1 application Nasal BID  . pantoprazole (PROTONIX) IV  40 mg Intravenous QHS  . senna-docusate  1 tablet Oral BID   PRN:  acetaminophen **OR** acetaminophen, labetalol  Diet:  DIET - DYS 1  honey  thick liquids Activity:  Up with assistance DVT Prophylaxis:  Lovenox CLINICALLY SIGNIFICANT STUDIES Basic Metabolic Panel:   Recent Labs Lab 10/28/14 1943 10/28/14 1952 11/01/14 0717  NA 141 139 139  K 4.3 4.1 3.0*  CL 101 102 100  CO2 24  --  24  GLUCOSE 161* 167* 130*  BUN 25* 27* 12  CREATININE 0.93 1.00 0.54  CALCIUM 10.2  --  9.1   Liver Function Tests:   Recent Labs Lab 10/28/14 1943  AST 13  ALT 9  ALKPHOS 69  BILITOT 0.3  PROT 7.3  ALBUMIN 3.6   CBC:   Recent Labs Lab 10/28/14 1943 10/28/14 1952 11/01/14 0717  WBC 11.1*  --  10.4  NEUTROABS 8.3*  --   --   HGB 11.5* 13.3 11.4*  HCT 36.1 39.0 35.3*  MCV 86.4  --  83.8  PLT 290  --  283   Coagulation:   Recent Labs Lab 10/28/14 1943  LABPROT 15.0  INR 1.16   Cardiac Enzymes: No results for input(s): CKTOTAL, CKMB, CKMBINDEX, TROPONINI in the last 168 hours. Urinalysis: No results for input(s): COLORURINE, LABSPEC, PHURINE, GLUCOSEU, HGBUR, BILIRUBINUR, KETONESUR, PROTEINUR, UROBILINOGEN, NITRITE, LEUKOCYTESUR in the last 168 hours.  Invalid input(s): APPERANCEUR Lipid PanelNo results found for: CHOL,  TRIG, HDL, CHOLHDL, VLDL, LDLCALC HgbA1C No results found for: HGBA1C  Urine Drug Screen:  No results found for: LABOPIA, COCAINSCRNUR, LABBENZ, AMPHETMU, THCU, LABBARB  Alcohol Level: No results for input(s): ETH in the last 168 hours.  Dg Swallowing Func-speech Pathology 10/31/2014    Diet Recommendation Dysphagia 1 (Puree);Honey-thick liquid     Follow Up Recommendations  Inpatient Rehab;Skilled Nursing facility;24 hour  supervision/assistance    Frequency and Duration min 2x/week  2 weeks        Carotid Doppler  Not ordered 2D Echocardiogram  Not ordered  CT head without contrast 10/28/2014 Acute left-sided thalamic hemorrhage with hematoma measuring about 1.6 cm. Chronic left-sided extra-axial fluid collection measuring about 8 mm in thickness. Three-4 mm left-to-right midline  shift secondary to the left thalamic hemorrhage and/ or left extra-axial fluid collection  CT head without contrast 10/29/2014 Increase in size of left thalamic hemorrhage now measuring 2.3 x 1.8 cm versus prior 1.7 x 1.6 cm.  Increase amount of surrounding vasogenic edema.  Slight increased mass effect upon the left lateral ventricle with 5.4 mm midline shift versus prior 3.3 mm. Small amount of blood is seen within the deep dependent aspect of the left lateral ventricle.   EKG   85 beats/min Sinus rhythm  Therapy Recommendations - CIR  Physical Exam   Elderly caucasian lady not in distress.    Afebrile. Head is nontraumatic. Neck is supple without bruit.  . Cardiac exam no murmur or gallop. Lungs are clear to auscultation. Distal pulses are well felt. Neurological Exam : Awake alert Mod dysarthria and can speak a few words and short sentences and can be understood with some difficulty. Follows only occasional commands. Left gaze preference and looked partially to the right but not all the way. Blinks to threat on the left but not the right. Fundi were not visualized. Moderate right lower facial weakness. Tongue midline. Dense right hemiplegia with hypotonia in the right upper extremity. Marked paratonia in the right lower extremity with some tenderness around the right knee which is chronic per the daughter. Purposeful antigravity strength on the left side. Right plantar upgoing left downgoing. Gait was not tested      ASSESSMENT Sandy Harris is a 80 y.o. female presenting with left thalamic hemorrhage likely hypertensive etiology with mild cytotoxic edema and mass effect on lateral ventricle with left to right brain shift but no hydrocephalus. On no antiplatelet agents prior to admission. Now on no antiplatelet agent for secondary stroke prevention secondary to hemorrhage.  Patient with resultant dysarthria, mild aphasia, right facial droop and right hemiparesis. Stroke work up  underway.   Hypertension - blood pressure still too high - resume home med - Lopressor 25 mg twice a day. May need a second medication as well. Consider ACE inhibitor but will need to monitor renal function.  Diabetes -mild hyperglycemia CBG 167  Elevated BUN - now WNL following IV fluids.  Bilateral lower extremity wounds -  Consult from Wound Care RN - 10/29/2014 - Eucerin cream for dry skin and Bactroban to promote healing - nursing and family feel wounds are improving.  UTI - Nursing reports follow urine  (patient received only 1 dose of IV Cipro). Urinalysis is not consistent with UTI. Urine culture is pending.   Dysphagia - cleared for dysphagia 1 diet with honey thick liquids - poor by mouth intake. NS at 50 mL per hour.  Hypokalemia - supplement and recheck in a.m.Marland Kitchen.  DNR - the patient's son  confirmed DO NOT RESUSCITATE status. Order placed.   Hospital day # 4   Delton See PA-C [facility] Pager [phone] 11/01/2014, 8:43 AM   I have personally examined this patient, reviewed notes, independently viewed imaging studies, participated in medical decision making and plan of care. I have made any additions or clarifications directly to the above note. Agree with note above. Await rehab/SNF transfer next few days  Delia Heady, MD Medical Director [facility] Pager: [phone] 11/01/2014 4:53 PM         To contact Stroke Continuity provider, please refer to [email or web address]. After hours, contact General Neurology

## 2014-11-02 DIAGNOSIS — I612 Nontraumatic intracerebral hemorrhage in hemisphere, unspecified: Secondary | ICD-10-CM

## 2014-11-02 DIAGNOSIS — G8190 Hemiplegia, unspecified affecting unspecified side: Secondary | ICD-10-CM

## 2014-11-02 LAB — BASIC METABOLIC PANEL
Anion gap: 15 (ref 5–15)
BUN: 14 mg/dL (ref 6–23)
CALCIUM: 9.1 mg/dL (ref 8.4–10.5)
CO2: 22 mEq/L (ref 19–32)
Chloride: 103 mEq/L (ref 96–112)
Creatinine, Ser: 0.53 mg/dL (ref 0.50–1.10)
GFR calc Af Amer: >90 mL/min (ref >90)
GFR, EST NON AFRICAN AMERICAN: 82 mL/min — AB (ref >90)
Glucose, Bld: 172 mg/dL — ABNORMAL HIGH (ref 70–99)
POTASSIUM: 3.8 meq/L (ref 3.7–5.3)
SODIUM: 140 meq/L (ref 137–147)

## 2014-11-02 NOTE — Progress Notes (Signed)
Rehab admissions - I met with pt in follow up to rehab MD consult to explain the possibility of inpatient rehab. Pt was just finishing up with OT. Pt's speech was very dysarthric and it was difficult to understand pt. I gave initial information about our rehab program and left brochures in pt's room.   I then called and spoke with pt's dtr, Tommie. Further information was given about our rehab program and dtr shared that their preference is for pt to go to SNF at [facility]. Dtr shared that pt has been to [facility] before and that pt's sister is currently staying there. In addition, dtr shared that they cannot provide 24 hour assistance and would like pt to have a more prolonged rehab stay at [facility]. Dtr also visited SNF today and shared that they have a bed available for her mom. I explained that social worker would be following up with pt/family to help plan for SNF. I gave contact information of social worker to dtr.  I then called and updated Hassan Rowan, case Freight forwarder and Dysheka, Education officer, museum.  I will now sign off pt's case. Please call me with any questions. Thanks.  Nanetta Batty, PT Rehabilitation Admissions Coordinator [phone]

## 2014-11-02 NOTE — Care Management Note (Signed)
[facility] & [facility]. Resumption of care orders requested upon discharge to home.

## 2014-11-02 NOTE — Progress Notes (Signed)
SLP Cancellation Note  Patient Details Name: Golden PopMattie H Quale MRN: [id number] DOB: 1924-02-14   Cancelled treatment:       Reason Eval/Treat Not Completed: Fatigue/lethargy limiting ability to participate. Pt not sufficiently alert for safe PO intake at time of SLP arrival despite multimodal stimulation from therapist. Will return as schedule allows.    Maxcine HamLaura Paiewonsky, M.A. CCC-SLP [phone]  Maxcine Hamaiewonsky, Kapono Luhn 11/02/2014, 1:20 PM

## 2014-11-02 NOTE — Progress Notes (Signed)
Occupational Therapy Treatment Patient Details Name: Sandy Harris MRN: [id number] DOB: [birth year] Today's Date: 11/02/2014    History of present illness Pt admitted with R side weakness and slurred speech due to L thalamic CVA.  PMH:  DM, HTN, chronic R knee pain requiring walker use.   OT comments  Pt currently with functional limitiations due to the deficits listed below (see OT problem list). Pt required max-total +2 assist for bed mobility and static sitting balance. Pt completed oral care and face washing at bed level with min assist. Pt will benefit from skilled OT to increase their independence and safety with adls and balance. Recommending CIR for intense rehab. Will continue to follow acutely to address OT goals.   Follow Up Recommendations  CIR    Equipment Recommendations  3 in 1 bedside comode;Wheelchair (measurements OT);Wheelchair cushion (measurements OT);Hospital bed    Recommendations for Other Services Rehab consult    Precautions / Restrictions Precautions Precautions: Fall Precaution Comments: dense R hemiplegia Restrictions Weight Bearing Restrictions: No       Mobility Bed Mobility Overal bed mobility: Needs Assistance;+2 for physical assistance Bed Mobility: Rolling;Sidelying to Sit;Sit to Sidelying Rolling: Max assist;+2 for physical assistance Sidelying to sit: Total assist;+2 for physical assistance     Sit to sidelying: Max assist;+2 for physical assistance General bed mobility comments: Verbal and tactile cues to initiate movement. Max-total +2 (A) for full roll onto R side and to protect RUE. Pt required (A) to bring LEs off/on bed and to maintain upright sitting position. Pt with R lateral lean.  Transfers                 General transfer comment: Did not attempt transfer due to state of arousal and dense R hemiplegia    Balance Overall balance assessment: Needs assistance Sitting-balance support: Single extremity supported;Feet  supported Sitting balance-Leahy Scale: Poor   Postural control: Right lateral lean                         ADL Overall ADL's : Needs assistance/impaired Eating/Feeding: Maximal assistance;Cueing for sequencing;Bed level Eating/Feeding Details (indicate cue type and reason): Pt holding juice container. Max (A) to bring honey thick cranberry juice on a spoon to mouth. Verbal cues for good swallow to clear oral cavity and throat.  Grooming: Wash/dry face;Oral care;Minimal assistance;Bed level Grooming Details (indicate cue type and reason): Min (A) to fully wash face. Set up provided for both ADL tasks.                               General ADL Comments: Pt able to follow commands to complete bed level ADLs. Pt required max-total +2 (A) for bed mobility and to sustain upright position while seated EOB.      Vision                     Perception     Praxis      Cognition   Behavior During Therapy: Flat affect Overall Cognitive Status: Difficult to assess                       Extremity/Trunk Assessment               Exercises     Shoulder Instructions       General Comments      Pertinent Vitals/ Pain  Pain Assessment: No/denies pain  Home Living                                          Prior Functioning/Environment              Frequency Min 3X/week     Progress Toward Goals  OT Goals(current goals can now be found in the care plan section)  Progress towards OT goals: Not progressing toward goals - comment  Acute Rehab OT Goals Patient Stated Goal: unable to state OT Goal Formulation: Patient unable to participate in goal setting Time For Goal Achievement: 11/13/14 Potential to Achieve Goals: Good ADL Goals Pt Will Perform Grooming: with min assist;sitting Pt Will Perform Upper Body Dressing: with min assist;sitting Pt Will Transfer to Toilet: with +2 assist;with mod assist;stand  pivot transfer Pt/caregiver will Perform Home Exercise Program: Increased ROM;Right Upper extremity;With Supervision Additional ADL Goal #1: Pt/family will be independent in positioning R UE and protecting from injury. Additional ADL Goal #2: Pt will perform bed mobility with +2 min assist. Additional ADL Goal #3: Pt will sit EOB x 5 minutes with min assist while participating in activity involving UE use.  Plan Discharge plan remains appropriate    Co-evaluation                 End of Session     Activity Tolerance Patient limited by lethargy   Patient Left in bed;with call bell/phone within reach;with bed alarm set   Nurse Communication Mobility status;Need for lift equipment;Precautions        Time: [id number] OT Time Calculation (min): 32 min  Charges: OT General Charges $OT Visit: 1 Procedure OT Treatments $Self Care/Home Management : 23-37 mins  Nils PyleBermel, [name] 11/02/2014, 3:40 PM

## 2014-11-02 NOTE — Progress Notes (Signed)
UR COMPLETED  

## 2014-11-02 NOTE — Progress Notes (Signed)
Stroke Team Progress Note  HISTORY Sandy Harris is a 80 y.o. female with a history of hypertension and diabetes who presents with right-sided weakness. She was last seen normal around 4:30 PM, and then when her sister try to check on her she was unable to get a hold of her. EMS had to obtain access to the residence and found the patient collapsed on the floor. She had emesis at that time. She is awake and the few things I am able to understand seem to make sense, but she is extraordinarily dysarthric. She has markedly venous stasis changes with ulcerations bilaterally. There is some erythema, the family states that this is long-standing for months and the patient denies any pain.  LKW: 4:30 PM on 10/28/2014. tpa was not given due to hemorrhage.  She was admitted to the neuro ICU for further evaluation and treatment.  SUBJECTIVE Patient without complaints. No family at bedside. Message left for daughter to call DR. Latoya Maulding for followup.   OBJECTIVE Most recent Vital Signs: Filed Vitals:   11/01/14 2140 11/02/14 0110 11/02/14 1042 11/02/14 1317  BP: 173/62 166/65 166/72 155/56  Pulse: 91 89 90 82  Temp: 99.4 F (37.4 C) 99 F (37.2 C) 98.8 F (37.1 C) 99.3 F (37.4 C)  TempSrc: Axillary Axillary Oral Oral  Resp: 18 18 18 18   Height:      Weight:      SpO2: 95% 95% 95% 93%   CBG (last 3)  No results for input(s): GLUCAP in the last 72 hours.  IV Fluid Intake:   . sodium chloride 50 mL/hr at 11/02/14 1130    MEDICATIONS  . antiseptic oral rinse  7 mL Mouth Rinse q12n4p  . chlorhexidine  15 mL Mouth Rinse BID  . enoxaparin  40 mg Subcutaneous q morning - 10a  . hydrocerin   Topical BID  . metoprolol tartrate  25 mg Oral BID  . pantoprazole  40 mg Oral QHS  . potassium chloride  20 mEq Oral TID  . senna-docusate  1 tablet Oral BID   PRN:  acetaminophen **OR** acetaminophen, labetalol  Diet:  DIET - DYS 1  honey thick liquids Activity:  Up with assistance DVT Prophylaxis:   Lovenox  CLINICALLY SIGNIFICANT STUDIES Basic Metabolic Panel:   Recent Labs Lab 11/01/14 0717 11/02/14 0650  NA 139 140  K 3.0* 3.8  CL 100 103  CO2 24 22  GLUCOSE 130* 172*  BUN 12 14  CREATININE 0.54 0.53  CALCIUM 9.1 9.1   Liver Function Tests:   Recent Labs Lab 10/28/14 1943  AST 13  ALT 9  ALKPHOS 69  BILITOT 0.3  PROT 7.3  ALBUMIN 3.6   CBC:   Recent Labs Lab 10/28/14 1943 10/28/14 1952 11/01/14 0717  WBC 11.1*  --  10.4  NEUTROABS 8.3*  --   --   HGB 11.5* 13.3 11.4*  HCT 36.1 39.0 35.3*  MCV 86.4  --  83.8  PLT 290  --  283   Coagulation:   Recent Labs Lab 10/28/14 1943  LABPROT 15.0  INR 1.16   Cardiac Enzymes: No results for input(s): CKTOTAL, CKMB, CKMBINDEX, TROPONINI in the last 168 hours. Urinalysis:   Recent Labs Lab 11/01/14 0937  COLORURINE YELLOW  LABSPEC 1.014  PHURINE 6.0  GLUCOSEU NEGATIVE  HGBUR TRACE*  BILIRUBINUR NEGATIVE  KETONESUR 15*  PROTEINUR 100*  UROBILINOGEN 1.0  NITRITE NEGATIVE  LEUKOCYTESUR NEGATIVE   Lipid PanelNo results found for: CHOL, TRIG, HDL, CHOLHDL,  VLDL, LDLCALC HgbA1C No results found for: HGBA1C  Urine Drug Screen:  No results found for: LABOPIA, COCAINSCRNUR, LABBENZ, AMPHETMU, THCU, LABBARB  Alcohol Level: No results for input(s): ETH in the last 168 hours.  Dg Swallowing Func-speech Pathology 10/31/2014    Diet Recommendation Dysphagia 1 (Puree);Honey-thick liquid     CT head without contrast 10/28/2014 Acute left-sided thalamic hemorrhage with hematoma measuring about 1.6 cm. Chronic left-sided extra-axial fluid collection measuring about 8 mm in thickness. Three-4 mm left-to-right midline shift secondary to the left thalamic hemorrhage and/ or left extra-axial fluid collection  CT head without contrast 10/29/2014 Increase in size of left thalamic hemorrhage now measuring 2.3 x 1.8 cm versus prior 1.7 x 1.6 cm.  Increase amount of surrounding vasogenic edema.  Slight  increased mass effect upon the left lateral ventricle with 5.4 mm midline shift versus prior 3.3 mm. Small amount of blood is seen within the deep dependent aspect of the left lateral ventricle.  EKG   85 beats/min Sinus rhythm  Follow Up therapy Recommendations  Inpatient Rehab;Skilled Nursing facility;24 hour  supervision/assistance    Frequency and Duration min 2x/week  2 weeks      Physical Exam   Elderly caucasian lady not in distress.    Afebrile. Head is nontraumatic. Neck is supple without bruit.  . Cardiac exam no murmur or gallop. Lungs are clear to auscultation. Distal pulses are well felt. Neurological Exam : Awake alert Mod dysarthria and can speak a few words and short sentences and can be understood with some difficulty. Follows only occasional commands. Left gaze preference and looked partially to the right but not all the way. Blinks to threat on the left but not the right. Fundi were not visualized. Moderate right lower facial weakness. Tongue midline. Dense right hemiplegia with hypotonia in the right upper extremity. Marked paratonia in the right lower extremity with some tenderness around the right knee which is chronic per the daughter. Purposeful antigravity strength on the left side. Right plantar upgoing left downgoing. Gait was not tested      ASSESSMENT Sandy Harris is a 80 y.o. female presenting with left thalamic hemorrhage likely hypertensive etiology with mild cytotoxic edema and mass effect on lateral ventricle with left to right brain shift but no hydrocephalus. On no antiplatelet agents prior to admission. Now on no antiplatelet agent for secondary stroke prevention secondary to hemorrhage.  Patient with resultant dysarthria, mild aphasia, right facial droop and right hemiparesis. Stroke work up completed. Medically ready for discharge.   Hypertension - blood pressure still up but less than the day prior. home med resumed - Lopressor 25 mg twice a day. May  need a second medication as well (ACE inhibitor but will need to monitor renal function.)  Diabetes -mild hyperglycemia.    Elevated BUN - now WNL   Bilateral lower extremity wounds -  Consult from Wound Care RN - 10/29/2014 - Eucerin cream for dry skin and Bactroban to promote healing - nursing and family feel wounds are improving.  UTI - Nursing reports follow urine  (patient received only 1 dose of IV Cipro). Urinalysis is not consistent with UTI. Urine culture w/ > 100k GNR. WBC normal. Afebrile. Continue to await final culture.  Dysphagia - cleared for dysphagia 1 diet with honey thick liquids - poor by mouth intake. NS at 50 mL per hour.  Hypokalemia - supplement given. 3.8 this am  DNR - the patient's son confirmed DO NOT RESUSCITATE status. Order placed.  Medically ready for discharge. SW consulted. Daughter working with [facility] for admission there; she states they have a bed.  Hospital day # 5  Rhoderick Moody [facility] Stroke Center See Amion for Pager information 11/02/2014 5:13 PM  I have personally examined this patient, reviewed notes, independently viewed imaging studies, participated in medical decision making and plan of care. I have made any additions or clarifications directly to the above note. Agree with note above. Await SNF bed  Delia Heady, MD Medical Director [facility] Stroke Center Pager: [phone] 11/02/2014 8:13 PM        To contact Stroke Continuity provider, please refer to [email or web address]. After hours, contact General Neurology

## 2014-11-02 NOTE — Progress Notes (Signed)
Speech Language Pathology Treatment: Dysphagia;Cognitive-Linquistic  Patient Details Name: Sandy PopMattie H Fodor MRN: [id number] DOB: 06-12-1924 Today's Date: 11/02/2014 Time: [id number] SLP Time Calculation (min) (ACUTE ONLY): 16 min  Assessment / Plan / Recommendation Clinical Impression  Pt consumed honey thick liquids via spoon, as she was unable to access it via straw sips. No overt signs of aspiration were noted. Pt did require Mod multimodal cueing from therapist for emergent awareness and basic problem solving related to management of left-sided anterior loss. SLP also provided Mod cueing and demonstrations for use of speech strategies, particularly pausing between words, in order to increase intelligibility at the word and short phrase level.   HPI HPI: Sandy Harris is an 80 y.o. female with a history of hypertension and diabetes who presents with right-sided weakness. CT Head shows left thalamic hemorrhage and midline shift.    Pertinent Vitals Pain Assessment: No/denies pain  SLP Plan  Continue with current plan of care    Recommendations Diet recommendations: Dysphagia 1 (puree);Honey-thick liquid Liquids provided via: Teaspoon;Cup Medication Administration: Crushed with puree Supervision: Full supervision/cueing for compensatory strategies Compensations: Slow rate;Small sips/bites;Check for pocketing;Check for anterior loss;Multiple dry swallows after each bite/sip Postural Changes and/or Swallow Maneuvers: Seated upright 90 degrees;Upright 30-60 min after meal              Oral Care Recommendations: Oral care BID Follow up Recommendations: Inpatient Rehab;Skilled Nursing facility;24 hour supervision/assistance Plan: Continue with current plan of care    GO      Sandy Harris, M.A. CCC-SLP [phone]  Sandy Hamaiewonsky, Sandy Harris 11/02/2014, 4:22 PM

## 2014-11-02 NOTE — Progress Notes (Signed)
Stroke Team Progress Note  HISTORY Sandy Harris is a 80 y.o. female with a history of hypertension and diabetes who presents with right-sided weakness. She was last seen normal around 4:30 PM, and then when her sister try to check on her she was unable to get a hold of her. EMS had to obtain access to the residence and found the patient collapsed on the floor. She had emesis at that time. She is awake and the few things I am able to understand seem to make sense, but she is extraordinarily dysarthric. She has markedly venous stasis changes with ulcerations bilaterally. There is some erythema, the family states that this is long-standing for months and the patient denies any pain.  LKW: 4:30 PM tpa given?: no, hemorrhage Patient was not administered TPA secondary to  Brain hemorrhage. She was admitted to the neuro ICU for further evaluation and treatment.  SUBJECTIVE The patient's family is not at the bedside. The patient was living alone prior to admission. She is showing some improvement and able to eat. Repeat K is 3.8. Await SNF bed.UA shows only 3-6 WBcs with negative nitrite and leucocyte estrase. Urine c/s grew gram negative rodsi possibly contaminant versus colonization as she has been afebrile and white count is 11.1 only hence will not treat for UTI    OBJECTIVE Most recent Vital Signs: Filed Vitals:   11/01/14 2140 11/02/14 0110 11/02/14 1042 11/02/14 1317  BP: 173/62 166/65 166/72 155/56  Pulse: 91 89 90 82  Temp: 99.4 F (37.4 C) 99 F (37.2 C) 98.8 F (37.1 C) 99.3 F (37.4 C)  TempSrc: Axillary Axillary Oral Oral  Resp: 18 18 18 18   Height:      Weight:      SpO2: 95% 95% 95% 93%   CBG (last 3)  No results for input(s): GLUCAP in the last 72 hours.  IV Fluid Intake:   . sodium chloride 50 mL/hr at 11/02/14 1130    MEDICATIONS  . antiseptic oral rinse  7 mL Mouth Rinse q12n4p  . chlorhexidine  15 mL Mouth Rinse BID  . enoxaparin  40 mg Subcutaneous q morning -  10a  . hydrocerin   Topical BID  . metoprolol tartrate  25 mg Oral BID  . pantoprazole  40 mg Oral QHS  . potassium chloride  20 mEq Oral TID  . senna-docusate  1 tablet Oral BID   PRN:  acetaminophen **OR** acetaminophen, labetalol  Diet:  DIET - DYS 1  honey thick liquids Activity:  Up with assistance DVT Prophylaxis:  Lovenox CLINICALLY SIGNIFICANT STUDIES Basic Metabolic Panel:   Recent Labs Lab 11/01/14 0717 11/02/14 0650  NA 139 140  K 3.0* 3.8  CL 100 103  CO2 24 22  GLUCOSE 130* 172*  BUN 12 14  CREATININE 0.54 0.53  CALCIUM 9.1 9.1   Liver Function Tests:   Recent Labs Lab 10/28/14 1943  AST 13  ALT 9  ALKPHOS 69  BILITOT 0.3  PROT 7.3  ALBUMIN 3.6   CBC:   Recent Labs Lab 10/28/14 1943 10/28/14 1952 11/01/14 0717  WBC 11.1*  --  10.4  NEUTROABS 8.3*  --   --   HGB 11.5* 13.3 11.4*  HCT 36.1 39.0 35.3*  MCV 86.4  --  83.8  PLT 290  --  283   Coagulation:   Recent Labs Lab 10/28/14 1943  LABPROT 15.0  INR 1.16   Cardiac Enzymes: No results for input(s): CKTOTAL, CKMB, CKMBINDEX, TROPONINI in  the last 168 hours. Urinalysis:   Recent Labs Lab 11/01/14 0937  COLORURINE YELLOW  LABSPEC 1.014  PHURINE 6.0  GLUCOSEU NEGATIVE  HGBUR TRACE*  BILIRUBINUR NEGATIVE  KETONESUR 15*  PROTEINUR 100*  UROBILINOGEN 1.0  NITRITE NEGATIVE  LEUKOCYTESUR NEGATIVE   Lipid PanelNo results found for: CHOL, TRIG, HDL, CHOLHDL, VLDL, LDLCALC HgbA1C No results found for: HGBA1C  Urine Drug Screen:  No results found for: LABOPIA, COCAINSCRNUR, LABBENZ, AMPHETMU, THCU, LABBARB  Alcohol Level: No results for input(s): ETH in the last 168 hours.  Dg Swallowing Func-speech Pathology 10/31/2014    Diet Recommendation Dysphagia 1 (Puree);Honey-thick liquid     Follow Up Recommendations  Inpatient Rehab;Skilled Nursing facility;24 hour  supervision/assistance    Frequency and Duration min 2x/week  2 weeks        Carotid Doppler  Not ordered 2D  Echocardiogram  Not ordered  CT head without contrast 10/28/2014 Acute left-sided thalamic hemorrhage with hematoma measuring about 1.6 cm. Chronic left-sided extra-axial fluid collection measuring about 8 mm in thickness. Three-4 mm left-to-right midline shift secondary to the left thalamic hemorrhage and/ or left extra-axial fluid collection  CT head without contrast 10/29/2014 Increase in size of left thalamic hemorrhage now measuring 2.3 x 1.8 cm versus prior 1.7 x 1.6 cm.  Increase amount of surrounding vasogenic edema.  Slight increased mass effect upon the left lateral ventricle with 5.4 mm midline shift versus prior 3.3 mm. Small amount of blood is seen within the deep dependent aspect of the left lateral ventricle.   EKG   85 beats/min Sinus rhythm  Therapy Recommendations - CIR  Physical Exam   Elderly caucasian lady not in distress.    Afebrile. Head is nontraumatic. Neck is supple without bruit.  . Cardiac exam no murmur or gallop. Lungs are clear to auscultation. Distal pulses are well felt. Neurological Exam : Awake alert Mod dysarthria and can speak a few words and short sentences and can be understood with some difficulty. Follows only occasional commands. Left gaze preference and looked partially to the right but not all the way. Blinks to threat on the left but not the right. Fundi were not visualized. Moderate right lower facial weakness. Tongue midline. Dense right hemiplegia with hypotonia in the right upper extremity. Marked paratonia in the right lower extremity with some tenderness around the right knee which is chronic per the daughter. Purposeful antigravity strength on the left side. Right plantar upgoing left downgoing. Gait was not tested      ASSESSMENT Sandy Harris is a 80 y.o. female presenting with left thalamic hemorrhage likely hypertensive etiology with mild cytotoxic edema and mass effect on lateral ventricle with left to right brain shift but no  hydrocephalus. On no antiplatelet agents prior to admission. Now on no antiplatelet agent for secondary stroke prevention secondary to hemorrhage.  Patient with resultant dysarthria, mild aphasia, right facial droop and right hemiparesis. Stroke work up underway.   Hypertension - blood pressure still too high - resume home med - Lopressor 25 mg twice a day. May need a second medication as well. Consider ACE inhibitor but will need to monitor renal function.  Diabetes -mild hyperglycemia CBG 167  Elevated BUN - now WNL following IV fluids.  Bilateral lower extremity wounds -  Consult from Wound Care RN - 10/29/2014 - Eucerin cream for dry skin and Bactroban to promote healing - nursing and family feel wounds are improving.  UTI - Nursing reports follow urine  (patient received only  1 dose of IV Cipro). Urinalysis is not consistent with UTI. Urine culture is pending.   Dysphagia - cleared for dysphagia 1 diet with honey thick liquids - poor by mouth intake. NS at 50 mL per hour.  Hypokalemia - supplement and recheck in a.m.Marland Kitchen.  DNR - the patient's son confirmed DO NOT RESUSCITATE status. Order placed.   Hospital day # 5   Delton SeeDavid Rinehuls PA-C [facility] Pager [phone] 11/02/2014, 2:20 PM   I have personally examined this patient, reviewed notes, independently viewed imaging studies, participated in medical decision making and plan of care. I have made any additions or clarifications directly to the above note. Agree with note above. Await rehab/SNF transfer next few days  Delia HeadyPramod Rida Loudin, MD Medical Director [facility] Pager: [phone] 11/02/2014 2:20 PM         To contact Stroke Continuity provider, please refer to [email or web address]. After hours, contact General Neurology

## 2014-11-02 NOTE — Consult Note (Signed)
Physical Medicine and Rehabilitation Consult Reason for Consult: Left thalamic hemorrhage Referring Physician: Dr. Pearlean Brownie   HPI: Sandy Harris is a 80 y.o. right handed female history of hypertension as well as diabetes mellitus. Admitted 10/28/2014 after being found down with right-sided weakness as well as dysarthric. Patient lives alone independently with a cane/walker secondary to knee pain prior to admission. Cranial CT scan showed acute left side thalamic hemorrhage measuring 1.6 cm with left to right midline shift. Patient did not receive TPA secondary to hemorrhage. Neurology consulted with conservative care close monitoring of blood pressure. Currently on dysphagia 1 honey thick liquid diet. Follow-up cranial CT scan stable. Subcutaneous Lovenox added for DVT prophylaxis 10/30/2014. MRSA PCR screen positive maintained on contact precautions. Physical therapy evaluation completed 10/30/2014 with recommendations of physical medicine rehabilitation consult.  Review of Systems  Eyes:       Blindness left eye  Gastrointestinal:       GERD  Musculoskeletal: Positive for myalgias and joint pain.  All other systems reviewed and are negative.  Past Medical History  Diagnosis Date  . Hypertension   . Diabetes mellitus without complication    History reviewed. No pertinent past surgical history. History reviewed. No pertinent family history. Social History:  reports that she has never smoked. She does not have any smokeless tobacco history on file. Her alcohol and drug histories are not on file. Allergies:  Allergies  Allergen Reactions  . Penicillins Other (See Comments)   Medications Prior to Admission  Medication Sig Dispense Refill  . acetaminophen (TYLENOL) 325 MG tablet Take 650 mg by mouth every 6 (six) hours as needed for mild pain.    Marland Kitchen glipiZIDE (GLUCOTROL) 10 MG tablet Take 10 mg by mouth 2 (two) times daily before a meal.     . meloxicam (MOBIC) 7.5 MG tablet Take  7.5 mg by mouth daily.     . metFORMIN (GLUCOPHAGE) 1000 MG tablet Take 1,000 mg by mouth 2 (two) times daily with a meal.     . metoprolol tartrate (LOPRESSOR) 25 MG tablet Take 25 mg by mouth 2 (two) times daily.     Marland Kitchen omeprazole (PRILOSEC) 20 MG capsule Take 20 mg by mouth daily.     . pioglitazone (ACTOS) 45 MG tablet Take 45 mg by mouth daily.    Marland Kitchen SYNTHROID 125 MCG tablet Take 125 mcg by mouth daily before breakfast.     . VOLTAREN 1 % GEL Apply 4 g topically 2 (two) times daily.       Home: Home Living Family/patient expects to be discharged to:: Private residence Living Arrangements: Alone Available Help at Discharge: Family, Available 24 hours/day (family can arrange 24 hour care) Type of Home: House Home Access: Stairs to enter [employer] of Steps: 1 Entrance Stairs-Rails: None Home Layout: One level Home Equipment: Environmental consultant - 2 wheels, Shower seat, Medical laboratory scientific officer - single point Additional Comments: Family willing to adapt home for accessibility.  Functional History: Prior Function Level of Independence: Independent with assistive device(s) Comments: used a RW primarily Functional Status:  Mobility: Bed Mobility Overal bed mobility: +2 for physical assistance, Needs Assistance Bed Mobility: Rolling, Supine to Sit, Sit to Supine Rolling: +2 for physical assistance, Max assist Supine to sit: +2 for physical assistance, Max assist Sit to supine: +2 for physical assistance, Max assist General bed mobility comments: verbal and physical cues to sequence        ADL: ADL Overall ADL's : Needs assistance/impaired Eating/Feeding:  NPO Grooming: Maximal assistance, Sitting Upper Body Bathing: Total assistance, Sitting Lower Body Bathing: Total assistance, Bed level, +2 for physical assistance Upper Body Dressing : Maximal assistance, Sitting Lower Body Dressing: Total assistance, Bed level, +2 for physical assistance  Cognition: Cognition Overall Cognitive Status:  Difficult to assess Arousal/Alertness: Awake/alert Orientation Level: Oriented X4 Attention: Sustained Sustained Attention: Impaired Sustained Attention Impairment: Functional basic Memory: Impaired Memory Impairment: Decreased recall of new information Awareness: Impaired Awareness Impairment: Emergent impairment Cognition Arousal/Alertness: Lethargic Behavior During Therapy: Flat affect Overall Cognitive Status: Difficult to assess Difficult to assess due to: Impaired communication  Blood pressure 166/65, pulse 89, temperature 99 F (37.2 C), temperature source Axillary, resp. rate 18, height 5\' 4"  (1.626 m), weight 81.2 kg (179 lb 0.2 oz), SpO2 95 %. Physical Exam  HENT:  Left facial droop  Neck: Normal range of motion. Neck supple. No thyromegaly present.  Cardiovascular: Normal rate and regular rhythm.   Respiratory: Effort normal and breath sounds normal. No respiratory distress.  GI: Soft. Bowel sounds are normal. She exhibits no distension.  Neurological: She is alert.  Patient is very dysarthric and hard of hearing. She does state her name and age. Exam limited by being hard of hearing. Follows simple commands. Looks to left but can be redirected to the right. 0/5 right arm and leg but senses pain--withdrawal response RLE when pinched.   Skin: Skin is warm and dry.    Results for orders placed or performed during the hospital encounter of 10/28/14 (from the past 24 hour(s))  Urinalysis, Routine w reflex microscopic     Status: Abnormal   Collection Time: 11/01/14  9:37 AM  Result Value Ref Range   Color, Urine YELLOW YELLOW   APPearance CLOUDY (A) CLEAR   Specific Gravity, Urine 1.014 1.005 - 1.030   pH 6.0 5.0 - 8.0   Glucose, UA NEGATIVE NEGATIVE mg/dL   Hgb urine dipstick TRACE (A) NEGATIVE   Bilirubin Urine NEGATIVE NEGATIVE   Ketones, ur 15 (A) NEGATIVE mg/dL   Protein, ur 132100 (A) NEGATIVE mg/dL   Urobilinogen, UA 1.0 0.0 - 1.0 mg/dL   Nitrite NEGATIVE  NEGATIVE   Leukocytes, UA NEGATIVE NEGATIVE  Urine microscopic-add on     Status: Abnormal   Collection Time: 11/01/14  9:37 AM  Result Value Ref Range   Squamous Epithelial / LPF RARE RARE   WBC, UA 3-6 <3 WBC/hpf   Bacteria, UA MANY (A) RARE  Basic metabolic panel     Status: Abnormal   Collection Time: 11/02/14  6:50 AM  Result Value Ref Range   Sodium 140 137 - 147 mEq/L   Potassium 3.8 3.7 - 5.3 mEq/L   Chloride 103 96 - 112 mEq/L   CO2 22 19 - 32 mEq/L   Glucose, Bld 172 (H) 70 - 99 mg/dL   BUN 14 6 - 23 mg/dL   Creatinine, Ser 4.400.53 0.50 - 1.10 mg/dL   Calcium 9.1 8.4 - 10.210.5 mg/dL   GFR calc non Af Amer 82 (L) >90 mL/min   GFR calc Af Amer >90 >90 mL/min   Anion gap 15 5 - 15   Dg Swallowing Func-speech Pathology  10/31/2014   Amy Cecille AverK Oleksiak, CCC-SLP     10/31/2014  3:11 PM Objective Swallowing Evaluation: Modified Barium Swallowing Study   Patient Details  Name: Sandy Harris MRN: [id number] Date of Birth: 08/18/1924  Today's Date: 10/31/2014 Time: [id number] SLP Time Calculation (min) (ACUTE ONLY): 40 min  Past Medical History:  Past Medical History  Diagnosis Date  . Hypertension   . Diabetes mellitus without complication    Past Surgical History: History reviewed. No pertinent past  surgical history. HPI:  Sandy Harris is an 80 y.o. female with a history of  hypertension and diabetes who presents with right-sided weakness.  CT Head shows left thalamic hemorrhage and midline shift.      Assessment / Plan / Recommendation Clinical Impression  Dysphagia Diagnosis: Severe oral phase dysphagia;Mild pharyngeal  phase dysphagia Clinical impression: Pt demonstrates a severe oral phase/ mild  pharyngeal phase dysphagia characterized by significantly  decreased oral manipulation/ oral transit; bolus prematurely  spills to the level of the pyriform sinuses resulting in pt  aspirating thin and nectar-thick liquids by straw. Pt sensed  aspiration and attempted to clear- small amount remained  below  the level of the vocal folds. Pt able to consume honey-thick  liquid by straw without penetration/ aspiration. Significant  amounts of oral residuals required suctioning following all  trials. Recommend initiating puree diet/ honey-thick liquids by  straw, meds crushed in puree. Full supervision required during  meals to cue pt to take small bites/ sips, have oral suction  available, cue pt to swallow multiple times and check oral cavity  following every bite/ sip. Pt will be a laborious feeder-  concerned for adequate nutrition and hydration given findings of  MBS. ST will continue to follow closely for diet tolerance/  advancement.    Treatment Recommendation  Therapy as outlined in treatment plan below    Diet Recommendation Dysphagia 1 (Puree);Honey-thick liquid   Liquid Administration via: Straw Medication Administration: Crushed with puree (alt means if  crushed is too difficult) Supervision: Full supervision/cueing for compensatory strategies Compensations: Slow rate;Small sips/bites;Check for  pocketing;Check for anterior loss;Multiple dry swallows after  each bite/sip Postural Changes and/or Swallow Maneuvers: Seated upright 90  degrees;Upright 30-60 min after meal    Other  Recommendations Oral Care Recommendations: Oral care BID Other Recommendations: Have oral suction available;Order  thickener from pharmacy   Follow Up Recommendations  Inpatient Rehab;Skilled Nursing facility;24 hour  supervision/assistance    Frequency and Duration min 2x/week  2 weeks   Pertinent Vitals/Pain n/a    SLP Swallow Goals     General HPI: Sandy PopMattie H Lappe is an 80 y.o. female with a history  of hypertension and diabetes who presents with right-sided  weakness. CT Head shows left thalamic hemorrhage and midline  shift.  Type of Study: Modified Barium Swallowing Study Reason for Referral: Objectively evaluate swallowing function Diet Prior to this Study: NPO Temperature Spikes Noted: No Respiratory Status: Room air  History of Recent Intubation: No Behavior/Cognition: Alert;Cooperative;Requires cueing Oral Cavity - Dentition: Edentulous Patient Positioning: Upright in chair Baseline Vocal Quality: Low vocal intensity Anatomy: Within functional limits Pharyngeal Secretions: Not observed secondary MBS    Reason for Referral Objectively evaluate swallowing function   Oral Phase Oral Preparation/Oral Phase Oral Phase: Impaired Oral - Honey Oral - Honey Cup: Left anterior bolus loss;Right anterior bolus  loss;Reduced posterior propulsion;Delayed oral  transit;Lingual/palatal residue;Weak lingual manipulation Oral - Nectar Oral - Nectar Cup: Left anterior bolus loss;Right anterior bolus  loss;Weak lingual manipulation;Reduced posterior  propulsion;Lingual/palatal residue;Delayed oral transit Oral - Nectar Straw: Left anterior bolus loss;Right anterior  bolus loss;Weak lingual manipulation;Reduced posterior  propulsion;Lingual/palatal residue;Delayed oral transit Oral - Thin Oral - Thin Cup: Left anterior bolus loss;Right anterior bolus  loss;Weak lingual manipulation;Reduced posterior  propulsion;Delayed oral transit;Lingual/palatal residue;Right  pocketing in lateral sulci;Left  pocketing in lateral  sulci;Pocketing in anterior sulcus Oral - Thin Straw: Left anterior bolus loss;Right anterior bolus  loss;Reduced posterior propulsion;Lingual/palatal residue;Delayed  oral transit;Weak lingual manipulation Oral - Solids Oral - Puree: Lingual/palatal residue   Pharyngeal Phase Pharyngeal Phase Pharyngeal Phase: Impaired Pharyngeal - Honey Pharyngeal - Honey Cup: Delayed swallow initiation;Pharyngeal  residue - valleculae Pharyngeal - Nectar Pharyngeal - Nectar Cup: Delayed swallow initiation;Premature  spillage to pyriform sinuses Pharyngeal - Nectar Straw: Delayed swallow initiation;Premature  spillage to pyriform sinuses;Penetration/Aspiration before  swallow;Moderate aspiration;Pharyngeal residue -  valleculae;Inter-arytenoid space  residue;Lateral channel residue Penetration/Aspiration details (nectar straw): Material enters  airway, passes BELOW cords and not ejected out despite cough  attempt by patient Pharyngeal - Thin Pharyngeal - Thin Cup: Delayed swallow initiation;Premature  spillage to pyriform sinuses Pharyngeal - Thin Straw: Delayed swallow initiation;Premature  spillage to pyriform sinuses;Penetration/Aspiration before  swallow;Moderate aspiration;Pharyngeal residue -  valleculae;Pharyngeal residue - pyriform sinuses;Inter-arytenoid  space residue;Lateral channel residue Penetration/Aspiration details (thin straw): Material enters  airway, passes BELOW cords and not ejected out despite cough  attempt by patient  Cervical Esophageal Phase    GO    Cervical Esophageal Phase Cervical Esophageal Phase: Karlyn Agee, Amy K, MA, CCC-SLP 10/31/2014, 3:07 PM     Assessment/Plan: Diagnosis: left thalamic hemorrhage with dense right hemiparesis and speech/swallowing deficits 1. Does the need for close, 24 hr/day medical supervision in concert with the patient's rehab needs make it unreasonable for this patient to be served in a less intensive setting? Yes 2. Co-Morbidities requiring supervision/potential complications: htn, dysphagia 3. Due to bladder management, bowel management, safety, skin/wound care, disease management, medication administration and patient education, does the patient require 24 hr/day rehab nursing? Yes 4. Does the patient require coordinated care of a physician, rehab nurse, PT (1-2 hrs/day, 5 days/week), OT (1-2 hrs/day, 5 days/week) and SLP (1-2 hrs/day, 5 days/week) to address physical and functional deficits in the context of the above medical diagnosis(es)? Yes Addressing deficits in the following areas: balance, endurance, locomotion, strength, transferring, bowel/bladder control, bathing, dressing, feeding, grooming, toileting, cognition, speech, swallowing and psychosocial support 5. Can the  patient actively participate in an intensive therapy program of at least 3 hrs of therapy per day at least 5 days per week? Potentially 6. The potential for patient to make measurable gains while on inpatient rehab is good 7. Anticipated functional outcomes upon discharge from inpatient rehab are min assist and mod assist  with PT, min assist and mod assist with OT, min assist and mod assist with SLP. 8. Estimated rehab length of stay to reach the above functional goals is: 15-20 days 9. Does the patient have adequate social supports and living environment to accommodate these discharge functional goals? Potentially 10. Anticipated D/C setting: Home 11. Anticipated post D/C treatments: HH therapy and Outpatient therapy 12. Overall Rehab/Functional Prognosis: good  RECOMMENDATIONS: This patient's condition is appropriate for continued rehabilitative care in the following setting: CIR pending family's ability to provide care at home. If family can't provide assist, inpatient rehab could be justified by reducing the burden of care prior to transition to next level of care. Patient has agreed to participate in recommended program. N/A Note that insurance prior authorization may be required for reimbursement for recommended care.  Comment: Rehab Admissions Coordinator to follow up.  Thanks,  Ranelle Oyster, MD, [place] [facility]     11/02/2014

## 2014-11-02 NOTE — Clinical Social Work Psychosocial (Signed)
Clinical Social Work Department BRIEF PSYCHOSOCIAL ASSESSMENT 11/02/2014  Patient:  Sandy Harris,Sandy Harris     Account Number:  [id number]     Admit date:  10/28/2014  Clinical Social Worker:  Bo McclintockBIBBS,Jevaeh Shams, LCSWA  Date/Time:  11/02/2014 05:31 PM  Referred by:  RN  Date Referred:  11/02/2014 Referred for  SNF Placement   Other Referral:   Interview type:  Family Other interview type:   Pt requested CSW to talk to her daughter regarding SNF placement; Tommie [phone]    PSYCHOSOCIAL DATA Living Status:  ALONE Admitted from facility:   Level of care:   Primary support name:  Arvid Rightommie Jodoin Primary support relationship to patient:  CHILD, ADULT Degree of support available:   [facility] Support System    CURRENT CONCERNS Current Concerns  None Noted   Other Concerns:    SOCIAL WORK ASSESSMENT / PLAN CSW contact the pt's daughter Tommie. CSW introduced self and purpose of visit. CSW and Tommie discussed the SNF rehab. CSW and Tommie discussed the geographic area in which the family is interest in receiving rehab. Tommie reported making arrangement with [facility]. Tommie reported that she place a bed hold for the pt. CSW contacted [place] to confirm bed hold. CSW provided the Tommie with contact information for further questions. CSW will continue to follow this pt and assist with discharge as needed.   Assessment/plan status:  Psychosocial Support/Ongoing Assessment of Needs Other assessment/ plan:   Information/referral to community resources:    PATIENT'S/FAMILY'S RESPONSE TO PLAN OF CARE: Tommie was very receptive to the pt's needs. Tommie already secured a rehab bed for the pt. Tommie reported being happy that [facility] can take the pt.   Nitesh Pitstick, MSW, LCSWA [phone]

## 2014-11-02 NOTE — Clinical Social Work Placement (Addendum)
Clinical Social Work Department CLINICAL SOCIAL WORK PLACEMENT NOTE 11/02/2014  Patient:  Sandy Harris,Sandy Harris  Account Number:  [id number] Admit date:  10/28/2014  Clinical Social Worker:  Gwynne EdingerYSHEKA BIBBS, LCSWA  Date/time:  11/02/2014 05:36 PM  Clinical Social Work is seeking post-discharge placement for this patient at the following level of care:   SKILLED NURSING   (*CSW will update this form in Epic as items are completed)   11/02/2014  [facility]/family provided with [facility] of Clinical Social Work's list of facilities offering this level of care within the geographic area requested by the patient (or if unable, by the patient's family).  11/02/2014  Patient/family informed of their freedom to choose among providers that offer the needed level of care, that participate in Medicare, Medicaid or managed care program needed by the patient, have an available bed and are willing to accept the patient.  11/02/2014  Patient/family informed of [facility]' ownership interest in [facility], as well as of the fact that they are under no obligation to receive care at this facility.  PASARR submitted to EDS on 04/22/14 PASARR number received on 04/22/14  FL2 transmitted to all facilities in geographic area requested by pt/family on  11/02/2014 FL2 transmitted to all facilities within larger geographic area on 11/02/2014  Patient informed that his/her managed care company has contracts with or will negotiate with  certain facilities, including the following:  11/03/14 - Received authorization from [employer] - #[id number]   Patient/family informed of bed offers received:  11/02/2014 Patient chooses bed at [facility]  Physician recommends and patient chooses bed at    Patient to be transferred to [facility] on 11/03/14   Patient to be transferred to facility by ambulance Patient and family notified of transfer on 11/03/14 Name of family member notified:   Daughter Sandy Harris at the bedside.  The following physician request were entered in Epic:  Additional Comments: Pt has existing PASSAR  Dysheka Bibbs, MSW, LCSWA [phone]

## 2014-11-03 LAB — URINE CULTURE

## 2014-11-03 LAB — GLUCOSE, CAPILLARY: Glucose-Capillary: 233 mg/dL — ABNORMAL HIGH (ref 70–99)

## 2014-11-03 MED ORDER — SENNOSIDES-DOCUSATE SODIUM 8.6-50 MG PO TABS: 1.0000 | ORAL_TABLET | Freq: Two times a day (BID) | ORAL | Status: AC

## 2014-11-03 MED ORDER — SULFAMETHOXAZOLE-TRIMETHOPRIM 400-80 MG PO TABS: 1.0000 | ORAL_TABLET | Freq: Two times a day (BID) | ORAL | Status: AC

## 2014-11-03 MED ORDER — HYDROCERIN EX CREA: 1.0000 "application " | TOPICAL_CREAM | Freq: Two times a day (BID) | CUTANEOUS | Status: AC

## 2014-11-03 MED ORDER — SULFAMETHOXAZOLE-TRIMETHOPRIM 400-80 MG PO TABS
1.0000 | ORAL_TABLET | Freq: Two times a day (BID) | ORAL | Status: DC
  Filled 2014-11-03: qty 1

## 2014-11-03 MED ORDER — POTASSIUM CHLORIDE CRYS ER 20 MEQ PO TBCR: 20.0000 meq | EXTENDED_RELEASE_TABLET | Freq: Every day | ORAL | Status: DC

## 2014-11-03 NOTE — Progress Notes (Signed)
NUTRITION FOLLOW UP  Intervention:   Continue Magic Cup ice cream BID with meals Recommend Multivitamin with minerals daily Continue to monitor  Nutrition Dx:   Increased nutrient needs related to wound healing as evidenced by estimated needs; ongoing  Goal:   Pt to meet >/= 90% of their estimated nutrition needs; unmet  Monitor:   Diet advancement, PO intake, supplement acceptance, weight trends  Assessment:   80 y.o. female with a history of hypertension and diabetes who presents with right-sided weakness.  Pt asleep at time of visit; no family present at bedside. Patient's diet was advanced to Dysphagia 1 with honey-thick liquids on 12/12. Per nursing notes pt has been consuming 25-50% of meals.  Labs: glucose ranging 130 to 233 mg/dL, low hemoglobin  Height: Ht Readings from Last 1 Encounters:  10/28/14 5\' 4"  (1.626 m)    Weight Status:   Wt Readings from Last 1 Encounters:  10/28/14 179 lb 0.2 oz (81.2 kg)    Re-estimated needs:  Kcal: 1500-1700 Protein: 75-90 grams Fluid: > 1.5 L/day  Skin: venous statis ulcers and stage II on toe  Diet Order: DIET - DYS 1, honey-thick liquids   Intake/Output Summary (Last 24 hours) at 11/03/14 1524 Last data filed at 11/03/14 1300  Gross per 24 hour  Intake    580 ml  Output      0 ml  Net    580 ml    Last BM: 12/14   Labs:   Recent Labs Lab 10/28/14 1943 10/28/14 1952 11/01/14 0717 11/02/14 0650  NA 141 139 139 140  K 4.3 4.1 3.0* 3.8  CL 101 102 100 103  CO2 24  --  24 22  BUN 25* 27* 12 14  CREATININE 0.93 1.00 0.54 0.53  CALCIUM 10.2  --  9.1 9.1  GLUCOSE 161* 167* 130* 172*    CBG (last 3)   Recent Labs  11/03/14 0652  GLUCAP 233*    Scheduled Meds: . antiseptic oral rinse  7 mL Mouth Rinse q12n4p  . chlorhexidine  15 mL Mouth Rinse BID  . enoxaparin  40 mg Subcutaneous q morning - 10a  . hydrocerin   Topical BID  . metoprolol tartrate  25 mg Oral BID  . pantoprazole  40 mg Oral QHS  .  potassium chloride  20 mEq Oral TID  . senna-docusate  1 tablet Oral BID    Continuous Infusions: . sodium chloride 50 mL/hr at 11/03/14 0800    Ian Malkineanne Barnett RD, LDN Inpatient Clinical Dietitian Pager: [phone] After Hours Pager: [phone]

## 2014-11-03 NOTE — Discharge Summary (Signed)
Stroke Discharge Summary  Patient ID: Sandy Harris    l   MRN: [id number]      DOB: 04-24-1924  Date of Admission: 10/28/2014 Date of Discharge: 11/03/2014  Attending Physician:  Delia HeadyPramod Mandela Bello, MD, Stroke MD  Consulting Physician(s):   Treatment Team:  Md Stroke, MD  Bebe Shaggyawn Ogles, RN (WOC), Faith RogueZachary Swartz, MD (Physical Medicine & Rehabtilitation)  Patient's PCP:  No primary care provider on file.  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  Past Medical History  Diagnosis Date  . Hypertension   . Diabetes mellitus without complication    History reviewed. No pertinent past surgical history.    Medication List    STOP taking these medications        meloxicam 7.5 MG tablet  Commonly known as:  MOBIC      TAKE these medications        glipiZIDE 10 MG tablet  Commonly known as:  GLUCOTROL  Take 10 mg by mouth 2 (two) times daily before a meal.     hydrocerin Crea  Apply 1 application topically 2 (two) times daily. Apply Eucerin cream to bilat legs Q day after bathing.  May leave open to air if not leaking.     metFORMIN 1000 MG tablet  Commonly known as:  GLUCOPHAGE  Take 1,000 mg by mouth 2 (two) times daily with a meal.     metoprolol tartrate 25 MG tablet  Commonly known as:  LOPRESSOR  Take 25 mg by mouth 2 (two) times daily.     omeprazole 20 MG capsule  Commonly known as:  PRILOSEC  Take 20 mg by mouth daily.     pioglitazone 45 MG tablet  Commonly known as:  ACTOS  Take 45 mg by mouth daily.     potassium chloride SA 20 MEQ tablet  Commonly known as:  K-DUR,KLOR-CON  Take 1 tablet (20 mEq  total) by mouth daily.     senna-docusate 8.6-50 MG per tablet  Commonly known as:  Senokot-S  Take 1 tablet by mouth 2 (two) times daily.     sulfamethoxazole-trimethoprim 400-80 MG per tablet  Commonly known as:  BACTRIM,SEPTRA  Take 1 tablet by mouth every 12 (twelve) hours.     SYNTHROID 125 MCG tablet  Generic drug:  levothyroxine  Take 125 mcg by mouth daily before breakfast.     TYLENOL 325 MG tablet  Generic drug:  acetaminophen  Take 650 mg by mouth every 6 (six) hours as needed for mild pain.     VOLTAREN 1 % Gel  Generic drug:  diclofenac sodium  Apply 4 g topically 2 (two) times daily.        LABORATORY STUDIES CBC    Component Value Date/Time   WBC 10.4 11/01/2014 0717   RBC 4.21 11/01/2014 0717   HGB 11.4* 11/01/2014 0717   HCT 35.3* 11/01/2014 0717   PLT 283 11/01/2014 0717   MCV 83.8 11/01/2014 0717   MCH 27.1 11/01/2014 0717   MCHC 32.3 11/01/2014 0717   RDW 15.7* 11/01/2014 0717   LYMPHSABS 2.0 10/28/2014 1943   MONOABS 0.7 10/28/2014 1943  EOSABS 0.1 10/28/2014 1943   BASOSABS 0.0 10/28/2014 1943   CMP    Component Value Date/Time   NA 140 11/02/2014 0650   K 3.8 11/02/2014 0650   CL 103 11/02/2014 0650   CO2 22 11/02/2014 0650   GLUCOSE 172* 11/02/2014 0650   BUN 14 11/02/2014 0650   CREATININE 0.53 11/02/2014 0650   CALCIUM 9.1 11/02/2014 0650   PROT 7.3 10/28/2014 1943   ALBUMIN 3.6 10/28/2014 1943   AST 13 10/28/2014 1943   ALT 9 10/28/2014 1943   ALKPHOS 69 10/28/2014 1943   BILITOT 0.3 10/28/2014 1943   GFRNONAA 82* 11/02/2014 0650   GFRAA >90 11/02/2014 0650   COAGS Lab Results  Component Value Date   INR 1.16 10/28/2014   Lipid PanelNo results found for: CHOL, TRIG, HDL, CHOLHDL, VLDL, LDLCALC HgbA1C No results found for: HGBA1C Cardiac Panel (last 3 results) No results for input(s): CKTOTAL, CKMB, TROPONINI, RELINDX in the last 72 hours. Urinalysis    Component Value Date/Time   COLORURINE YELLOW 11/01/2014 0937    APPEARANCEUR CLOUDY* 11/01/2014 0937   LABSPEC 1.014 11/01/2014 0937   PHURINE 6.0 11/01/2014 0937   GLUCOSEU NEGATIVE 11/01/2014 0937   HGBUR TRACE* 11/01/2014 0937   BILIRUBINUR NEGATIVE 11/01/2014 0937   KETONESUR 15* 11/01/2014 0937   PROTEINUR 100* 11/01/2014 0937   UROBILINOGEN 1.0 11/01/2014 0937   NITRITE NEGATIVE 11/01/2014 0937   LEUKOCYTESUR NEGATIVE 11/01/2014 0937   Urine Drug Screen No results found for: LABOPIA, COCAINSCRNUR, LABBENZ, AMPHETMU, THCU, LABBARB  Alcohol Level No results found for: [facility]   SIGNIFICANT DIAGNOSTIC STUDIES Dg Swallowing Func-speech Pathology 10/31/2014  Diet Recommendation Dysphagia 1 (Puree);Honey-thick liquid   CT head without contrast 10/28/2014 Acute left-sided thalamic hemorrhage with hematoma measuring about 1.6 cm. Chronic left-sided extra-axial fluid collection measuring about 8 mm in thickness. Three-4 mm left-to-right midline shift secondary to the left thalamic hemorrhage and/ or left extra-axial fluid collection  CT head without contrast 10/29/2014 Increase in size of left thalamic hemorrhage now measuring 2.3 x 1.8 cm versus prior 1.7 x 1.6 cm.  Increase amount of surrounding vasogenic edema.  Slight increased mass effect upon the left lateral ventricle with 5.4 mm midline shift versus prior 3.3 mm. Small amount of blood is seen within the deep dependent aspect of the left lateral ventricle.  EKG 85 beats/min Sinus rhythm     HISTORY OF PRESENT ILLNESS Sandy Harris is a 80 y.o. female with a history of hypertension and diabetes who presents with right-sided weakness. She was last seen normal around 4:30 PM on 10/28/2014, and then when her sister try to check on her she was unable to get a hold of her. EMS had to obtain access to the residence and found the patient collapsed on the floor. She had emesis at that time. In the ED, she was awake and the few things I am able to understand seem to make sense, but was  extraordinarily dysarthric. She has markedly venous stasis changes with ulcerations bilaterally. There is some erythema, the family states that this is long-standing for months and the patient denies any pain. tpa was not given due to hemorrhage. She was admitted to the neuro ICU for further evaluation and treatment.   HOSPITAL COURSE Sandy Harris is a 80 y.o. female presenting with left thalamic hemorrhage likely hypertensive etiology with mild cytotoxic edema and mass effect on lateral ventricle with left to right brain shift but no hydrocephalus. On no antiplatelet agents prior to  admission. Now on no antiplatelet agent for secondary stroke prevention secondary to hemorrhage. Patient with resultant dysarthria, mild aphasia, right facial droop and right hemiparesis. Stroke work up completed. Medically ready for discharge.   Hypertension - blood pressure continues to improve on home Lopressor 25 mg twice a day resumed. May need a second medication in the future.   Diabetes -mild hyperglycemia.   Elevated BUN - now WNL   Bilateral lower extremity wounds - Consult from Wound Care RN - 10/29/2014 - Eucerin cream for dry skin and Bactroban to promote healing - nursing and family feel wounds are improving.  UTI - Nursing reports follow urine (patient received only 1 dose of IV Cipro). Urinalysis is not consistent with UTI. Urine culture w/ > 100k e coli. WBC normal. Afebrile. Resistant to Cipro. Will place on Sulfa course at discharge.  Dysphagia - cleared for dysphagia 1 diet with honey thick liquids - poor by mouth intake. NS at 50 mL per hour.  Hypokalemia - supplement given tid in hospital. Up to 3.8. Will decrease to daily at discharge. Needs follow up.   DNR - the patient's son confirmed DO NOT RESUSCITATE status.   Follow Up therapy Recommendations Inpatient Rehab;Skilled Nursing facility;24 hour supervision/assistance Frequency and Duration min 2x/week 2 weeks    SW consulted. Medically ready for discharge. [facility]; she states they have a bed.   DISCHARGE EXAM Blood pressure 147/50, pulse 81, temperature 97.7 F (36.5 C), temperature source Oral, resp. rate 18, height 5\' 4"  (1.626 m), weight 81.2 kg (179 lb 0.2 oz), SpO2 98 %. Elderly caucasian lady not in distress. Afebrile. Head is nontraumatic. Neck is supple without bruit. . Cardiac exam no murmur or gallop. Lungs are clear to auscultation. Distal pulses are well felt. Neurological Exam : Awake alert Mod dysarthria and can speak a few words and short sentences and can be understood with some difficulty. Follows only occasional commands. Left gaze preference and looked partially to the right but not all the way. Blinks to threat on the left but not the right. Fundi were not visualized. Moderate right lower facial weakness. Tongue midline. Dense right hemiplegia with hypotonia in the right upper extremity. Marked paratonia in the right lower extremity with some tenderness around the right knee which is chronic per the daughter. Purposeful antigravity strength on the left side. Right plantar upgoing left downgoing. Gait was not tested    Discharge Diet   DIET - DYS 1 honey thick liquids  DISCHARGE PLAN  Disposition:  Discharge to skilled nursing facility for ongoing PT, OT and ST.  Due to hemorrhage and risk of bleeding, do not take aspirin, aspirin-containing medications, or ibuprofen products (for example, Ibuprofen, Anacin, Alka-Seltzer, Bufferin, Ecotrin, Dristan, Sine-Off, Midol, Nuprin, Aleve, Thera-Flu, Advil). You may take Tylenol.   Follow-up MD at skilled nursing facility  Follow-up with Dr. Delia HeadyPramod Vollie Brunty, Stroke Clinic in 1 month.  45 minutes were spent preparing discharge.  Sandy MoodyBIBY,SHARON  [facility] See Amion for Pager information 11/03/2014 3:40 PM  I have personally examined this patient, reviewed notes,  independently viewed imaging studies, participated in medical decision making and plan of care. I have made any additions or clarifications directly to the above note. Agree with note above.    Delia HeadyPramod Shantika Bermea, MD Medical Director [facility] Pager: [phone] 11/03/2014 4:43 PM

## 2014-11-03 NOTE — Progress Notes (Signed)
Speech Language Pathology Treatment: Dysphagia;Cognitive-Linquistic  Patient Details Name: Sandy Harris MRN: [id number] DOB: [birth year] Today's Date: 11/03/2014 Time: [id number] SLP Time Calculation (min) (ACUTE ONLY): 16 min  Assessment / Plan / Recommendation Clinical Impression  Skilled treatment session focused on addressing cognition and dysphagia goals.  SLP facilitated session with Max verbal and visual cues for brief periods (30-60 seconds) of sustained attention to self-feeding.  Patient also required Max encouragement to self-feed Dys. 1 textures and honey-thick liquids via straw.  Patient was able to consume 2-3 sips via straw prior to fatigue limiting ability which was when she requested SLP assist with spoon sips of honey-thick liquids.  Honey-thick liquids via teaspoon that were SLP administered resulted in need for cues to complete bilabial seal, increased multiple swallows and delayed cough.  Patient's optimal swallow function is when she is self-feeding; please continue to encourage her to to do so despite fatigue. May need to consider implementing small more frequent meals.  Continue with current plan of care.        HPI HPI: Sandy Harris is an 80 y.o. female with a history of hypertension and diabetes who presents with right-sided weakness. CT Head shows left thalamic hemorrhage and midline shift.    Pertinent Vitals Pain Assessment: No/denies pain, increased wheezes per chart review   SLP Plan  Continue with current plan of care    Recommendations Diet recommendations: Dysphagia 1 (puree);Honey-thick liquid Liquids provided via: Teaspoon;Cup Medication Administration: Crushed with puree Supervision: Full supervision/cueing for compensatory strategies Compensations: Slow rate;Small sips/bites;Check for pocketing;Check for anterior loss;Multiple dry swallows after each bite/sip Postural Changes and/or Swallow Maneuvers: Seated upright 90 degrees;Upright 30-60 min after  meal              Oral Care Recommendations: Oral care BID;Oral care before and after PO Follow up Recommendations: Skilled Nursing facility;24 hour supervision/assistance Plan: Continue with current plan of care    GO    Charlane FerrettiMelissa Jamall Strohmeier, M.A., CCC-SLP [phone]  Feliberto Stockley 11/03/2014, 3:52 PM

## 2014-11-03 NOTE — Progress Notes (Signed)
Report called to [facility]. A copy of DC instructions to be included in transport packet.

## 2014-11-03 NOTE — Progress Notes (Signed)
Physical Therapy Treatment Patient Details Name: Sandy Harris: [id number] DOB: [birth year] Today's Date: 11/03/2014    History of Present Illness Pt admitted with R side weakness and slurred speech due to L thalamic CVA.  PMH:  DM, HTN, chronic R knee pain requiring walker use.    PT Comments    Patient able to transfer to recliner with +2 total assist. Family is now planning to have patient to DC to [facility] instead of [facility]. Will make updates accordingly  Follow Up Recommendations  SNF     Equipment Recommendations       Recommendations for Other Services       Precautions / Restrictions Precautions Precautions: Fall Precaution Comments: dense R hemiplegia    Mobility  Bed Mobility Overal bed mobility: Needs Assistance;+2 for physical assistance Bed Mobility: Rolling;Sidelying to Sit Rolling: Max assist;+2 for physical assistance Sidelying to sit: Total assist;+2 for physical assistance       General bed mobility comments: Verbal and tactile cues to initiate movement. Max-total +2 (A) for full roll onto R side and to protect RUE. Pt required (A) to bring LEs off/on bed and to maintain upright sitting position. Pt with R lateral lean.  Transfers Overall transfer level: Needs assistance Equipment used: 2 person hand held assist Transfers: Squat Pivot Transfers     Squat pivot transfers: +2 physical assistance;Total assist     General transfer comment: A with all aspects of transfer and use of chuck pad. Patient was able to assist with staying forward with transfer.   Ambulation/Gait                 Stairs            Wheelchair Mobility    Modified Rankin (Stroke Patients Only)       Balance     Sitting balance-Leahy Scale: Poor Sitting balance - Comments: Patient required Min to Mod A for sitting EOB. Patient with L lean     Standing balance-Leahy Scale: Zero                      Cognition  Arousal/Alertness: Awake/alert Behavior During Therapy: Flat affect Overall Cognitive Status: Difficult to assess                      Exercises      General Comments        Pertinent Vitals/Pain Pain Assessment: No/denies pain    Home Living                      Prior Function            PT Goals (current goals can now be found in the care plan section) Progress towards PT goals: Progressing toward goals    Frequency  Min 3X/week    PT Plan Discharge plan needs to be updated    Co-evaluation             End of Session Equipment Utilized During Treatment: Gait belt Activity Tolerance: Patient limited by fatigue Patient left: in chair;with call bell/phone within reach;with family/visitor present     Time: [id number] PT Time Calculation (min) (ACUTE ONLY): 24 min  Charges:  $Therapeutic Activity: 23-37 mins                    G Codes:      Fredrich BirksRobinette, Julia Elizabeth 11/03/2014, 12:03 PM 11/03/2014 Robinette, Adline PotterJulia Elizabeth  PTA [phone] pager [phone] office

## 2015-02-04 ENCOUNTER — Ambulatory Visit (INDEPENDENT_AMBULATORY_CARE_PROVIDER_SITE_OTHER): Payer: Commercial Managed Care - HMO | Admitting: Neurology

## 2015-02-04 ENCOUNTER — Encounter: Payer: Self-pay | Admitting: Neurology

## 2015-02-04 VITALS — BP 129/81 | HR 97

## 2015-02-04 DIAGNOSIS — I610 Nontraumatic intracerebral hemorrhage in hemisphere, subcortical: Secondary | ICD-10-CM

## 2015-02-04 NOTE — Patient Instructions (Signed)
I had a long d/w patient and sons about her recent  Brain hemorrhage, risk for recurrent stroke/TIAs, personally independently reviewed imaging studies and stroke evaluation results and answered questions   Maintain strict control of hypertension with blood pressure goal below 130/90, diabetes with hemoglobin A1c goal below 6.5% and lipids with LDL cholesterol goal below 100 mg/dL. continue ongoing physical and occupational therapy. Followup in the future with me in 6 months

## 2015-02-04 NOTE — Progress Notes (Signed)
[facility] [address] [place]. [place] [address] [phone]       OFFICE FOLLOW-UP NOTE  [name] Date of Birth:  15-Sep-1924 Medical Record Number:  [id number]   HPI: 61 year Caucasian lady seen today for first office follow-up visit following hospital admission for stroke on 10/28/14. She is accompanied by her sons who provide history She presented with sudden onset right-sided weakness. EMS had to obtain access to her residence and she was found collapsed on the floor. She had vomited. She was awake but had dysarthric speech and was difficult to understand. She had marked venous stasis changes and ulcerations on her skin bilaterally. Blood pressure was significantly elevated. She was admitted to the intensive care unit. She was found to have left thalamic hematoma measuring 2.3 x 1.8 cm with mild surrounding vasogenic edema and slight mass effect but no hydrocephalus or intraventricular extension. Follow-up imaging studies showed stable appearance of the hematoma. Urine drug screen was negative. She was started on medications for strict blood pressure control and after she remained stable in the intensive care unit she was transferred to the neurology floor. She was found to have urinary tract infection with greater than 100,000 colonies of Escherichia coli for which she was treated with ciprofloxacin. She had dysarthria and dysphagia and dense right hemiplegia. She was initially cleared for dysphagia 1 diet which was advanced and she improved. She was transferred for rehabilitation to skilled nursing facility where she still living. She will was initially getting regular therapies but these were interrupted plain suture runs reasons but now she has restarted them recently. Her speech is much improved and she can speak clearly as well as can swallow and eat much better however she still has dense right hemiplegia. She is able to stand with holding onto to therapists and  she can ride a bike but she has not yet been able to walk. Blood pressure has been well controlled.  ROS:   14 system review of systems is positive for weakness, gait difficulty, speech difficulty, swallowing difficulty and all other systems negative  PMH:  Past Medical History  Diagnosis Date  . Hypertension   . Diabetes mellitus without complication     Social History:  History   Social History  . Marital Status: Widowed    Spouse Name: N/A  . Number of Children: N/A  . Years of Education: N/A   Occupational History  . Not on file.   Social History Main Topics  . Smoking status: Never Smoker   . Smokeless tobacco: Not on file  . Alcohol Use: Not on file  . Drug Use: Not on file  . Sexual Activity: No   Other Topics Concern  . Not on file   Social History Narrative    Medications:   Current Outpatient Prescriptions on File Prior to Visit  Medication Sig Dispense Refill  . acetaminophen (TYLENOL) 325 MG tablet Take 650 mg by mouth every 6 (six) hours as needed for mild pain.    Marland Kitchen glipiZIDE (GLUCOTROL) 10 MG tablet Take 10 mg by mouth 2 (two) times daily before a meal.     . hydrocerin (EUCERIN) CREA Apply 1 application topically 2 (two) times daily. Apply Eucerin cream to bilat legs Q day after bathing.  May leave open to air if not leaking. 1000 g prn  . metFORMIN (GLUCOPHAGE) 1000 MG tablet Take 1,000 mg by mouth 2 (two) times daily with a meal.     .  metoprolol tartrate (LOPRESSOR) 25 MG tablet Take 25 mg by mouth 2 (two) times daily.     Marland Kitchen. omeprazole (PRILOSEC) 20 MG capsule Take 20 mg by mouth daily.     . pioglitazone (ACTOS) 45 MG tablet Take 45 mg by mouth daily.    Marland Kitchen. senna-docusate (SENOKOT-S) 8.6-50 MG per tablet Take 1 tablet by mouth 2 (two) times daily. 60 tablet 2  . SYNTHROID 125 MCG tablet Take 125 mcg by mouth daily before breakfast.     . VOLTAREN 1 % GEL Apply 4 g topically 2 (two) times daily.      No current facility-administered medications on  file prior to visit.    Allergies:   Allergies  Allergen Reactions  . Penicillins Other (See Comments)    Physical Exam General: Frail elderly Caucasian lady, seated in a wheelchair., in no evident distress Head: head normocephalic and atraumatic.  Neck: supple with no carotid or supraclavicular bruits Cardiovascular: regular rate and rhythm, no murmurs Musculoskeletal: no deformity Skin:  no rash/petichiae Vascular:  Normal pulses all extremities Filed Vitals:   02/04/15 0907  BP: 129/81  Pulse: 97   Neurologic Exam Mental Status: Awake and fully alert. Oriented to place and time. Recent and remote memory intact. Attention span, concentration and fund of knowledge diminished. Mood and affect appropriate.  Cranial Nerves: Fundoscopic exam reveals sharp disc margins. Pupils equal, briskly reactive to light. Extraocular movements full without nystagmus. Visual fields full to confrontation. Hearing intact. Facial sensation intact.Mild right lower face weakness. Tongue, palate moves normally and symmetrically.  Motor: Dense flaccid right hemiplegia with 0/5 right upper extremity and 1/5 right hip and 0/5 right knee and ankle strength.  Sensory.: intact to touch ,pinprick .position and vibratory sensation on the left but all diminished on the right.  Coordination: Rapid alternating movements normal on the left and cannot be tested on the right.  Gait and Station: Sitting in a wheelchair. Cannot get up even with assistance  Reflexes: 1+ on the left and absent on the right   Toes downgoing.   NIHSS  10 Modified Rankin  4  ASSESSMENT: [age] year Caucasian lady with left thalamic hypertensive intracerebral hemorrhage in December 2015 with significant residual dense right hemiplegia but improvement in dysarthria and dysphagia.     PLAN: I had a long d/w patient and sons about her recent  brain hemorrhage, risk for recurrent stroke/TIAs, personally independently reviewed imaging studies and  stroke evaluation results and answered questions   Maintain strict control of hypertension with blood pressure goal below 130/90, diabetes with hemoglobin A1c goal below 6.5% and lipids with LDL cholesterol goal below 100 mg/dL. Continue ongoing physical and occupational therapy. Followup in the future with me in 6 months.      Note: This document was prepared with digital dictation and possible smart phrase technology. Any transcriptional errors that result from this process are unintentional

## 2015-03-09 NOTE — Op Note (Signed)
PATIENT NAME:  Sandy Harris, Merl H MR#:  [id number] DATE OF BIRTH:  Apr 30, 1924  DATE OF PROCEDURE:  06/10/2012  PREOPERATIVE DIAGNOSIS:  Cataract, right eye.   POSTOPERATIVE DIAGNOSIS:  Cataract, right eye.  PROCEDURE PERFORMED:  Extracapsular cataract extraction using phacoemulsification with placement of an Alcon [id number], 24.5-diopter posterior chamber lens, serial #[id number].  SURGEON:  Maylon PeppersSteven A. Kawana Hegel, MD  ASSISTANT:  None.  ANESTHESIA:  4% lidocaine and 0.75% Marcaine in a 50/50 mixture with 10 units/mL of Hylenex added, given as a peribulbar.  ANESTHESIOLOGIST:  Dr. Dimple Caseyice.  COMPLICATIONS:  None.  ESTIMATED BLOOD LOSS:  Less than 1 ml.  DESCRIPTION OF PROCEDURE:  The patient was brought to the operating room and given a peribulbar block.  The patient was then prepped and draped in the usual fashion.  The vertical rectus muscles were imbricated using 5-0 silk sutures.  These sutures were then clamped to the sterile drapes as bridle sutures.  A limbal peritomy was performed extending two clock hours and hemostasis was obtained with cautery.  A partial thickness scleral groove was made at the surgical limbus and dissected anteriorly in a lamellar dissection using an Alcon crescent knife.  The anterior chamber was entered superonasally with a Superblade and through the lamellar dissection with a 2.6 mm keratome.  DisCoVisc was used to replace the aqueous and a continuous tear capsulorrhexis was carried out.  Hydrodissection and hydrodelineation were carried out with balanced salt and a 27 gauge canula.  The nucleus was rotated to confirm the effectiveness of the hydrodissection.  Phacoemulsification was carried out using a divide-and-conquer technique.  Total ultrasound time was two minutes and 50.4 seconds with an average power of 22.8 percent and a CDE of 60.8.  Irrigation/aspiration was used to remove the residual cortex.  DisCoVisc was used to inflate the capsule and the internal  incision was enlarged to 3 mm with the crescent knife.  The intraocular lens was folded and inserted into the capsular bag using the AcrySert delivery system.  Irrigation/aspiration was used to remove the residual DisCoVisc.  Miostat was injected into the anterior chamber through the paracentesis track to inflate the anterior chamber and induce miosis.  The wound was checked for leaks and none were found. The conjunctiva was closed with cautery and the bridle sutures were removed.  Two drops of 0.3% Vigamox were placed on the eye.   An eye shield was placed on the eye.  The patient was discharged to the recovery room in good condition.   ____________________________ Maylon PeppersSteven A. Etha Stambaugh, MD sad:ap D: 06/10/2012 13:25:38 ET T: 06/10/2012 13:49:35 ET JOB#: [id number]  cc: Viviann SpareSteven A. Kenyatta Keidel, MD, <Dictator> Erline LevineSTEVEN A Sterlin Knightly MD ELECTRONICALLY SIGNED 06/24/2012 13:40

## 2015-03-13 NOTE — Discharge Summary (Signed)
PATIENT NAME:  Sandy Harris, Sandy Harris MR#:  [id number] DATE OF BIRTH:  07/30/1924  DATE OF ADMISSION:  04/21/2014 DATE OF DISCHARGE:  04/27/2014  TYPE OF DISCHARGE: The patient is transferred to skilled nursing facility.   REASON FOR ADMISSION: Weakness and fever.   HISTORY OF PRESENT ILLNESS: The patient is an 81-year-old female with a history of diabetes and hypertension, who presents to the Emergency Room with progressive weakness and chills, unable to walk and stand. In the Emergency Room, the patient was noted to have a urinary tract infection. She was noted to have an elevated blood cell count of 19,000 and was admitted for further evaluation.   PAST MEDICAL HISTORY: 1. Benign hypertension.  2. Type 2 diabetes.  3. Hyperlipidemia.  4. Hypothyroidism.   MEDICATIONS ON ADMISSION: Please see admission note.   ALLERGIES: PENICILLIN.   SOCIAL HISTORY: As per admission note.  FAMILY HISTORY: As per admission note.  REVIEW OF SYSTEMS: As per admission note.   PHYSICAL EXAMINATION:  GENERAL: The patient was in no acute distress.  VITAL SIGNS: Remarkable for a blood pressure of 186/84, respiratory rate of 20, temperature of 98.2.  HEENT: Unremarkable.   NECK: Supple without JVD.  LUNGS: Clear.  CARDIAC: Regular rate and rhythm. Normal S1, S2.  ABDOMEN: Soft and nontender.  EXTREMITIES: Without edema.  NEUROLOGIC: Grossly nonfocal.   HOSPITAL COURSE: The patient was admitted with an urinary tract infection and possible sepsis. She was started on IV fluids with IV antibiotics. Her sugars were initially elevated but improved with therapy. While in the hospital, the patient's blood cultures returned positive for Escherichia coli as did her urine culture. She was maintained on IV antibiotics for five days and converted over to p.o. antibiotics. She continued to do well. Her white count normalized. Her sugars improved. She was seen by physical therapy who recommended placement for strengthening  and rehab. The patient and family agreed. She is now transferred to the skilled nursing facility for further care and rehabilitation.   DISCHARGE DIAGNOSES: 1. Escherichia coli sepsis.  2. Urinary tract infection.  3. Type 2 diabetes.  4. Hypokalemia.  5. Hypothyroidism.  6. Benign hypertension.   DISCHARGE MEDICATIONS: 1. Harpers  low dose sliding scale insulin with Accu-Cheks before meals and at bedtime.  2. Klor-Con 20 mEq p.o. b.i.d.  3. Cipro 500 mg p.o. b.i.d. x 10 days.  4. Synthroid 125 mcg p.o. daily.  5. Lopressor 25 mg p.o. b.i.d.  6. Glipizide 10 mg p.o. b.i.d.  7. Aspirin 81 mg p.o. daily.  8. Protonix 40 mg p.o. daily.  9. Zofran 4 mg p.o. q.4 hours p.r.n. nausea and vomiting.   FOLLOW-UP PLANS AND APPOINTMENTS: The patient will be followed by the resident physician at the skilled nursing facility. She is on a 2 gram sodium, 1800 calorie ADA diet. She will be seen in consultation by physical therapy. We will obtain a CBC and a MET-B in one week. She will follow up with me at the [facility] in two weeks, sooner if needed.   ____________________________ Jeffrey D. Sparks, MD jds:sg D: 04/27/2014 07:17:00 ET T: 04/27/2014 07:43:18 ET JOB#: [id number]  cc: Jeffrey D. Sparks, MD, <Dictator> JEFFREY D SPARKS MD ELECTRONICALLY SIGNED 04/27/2014 9:49 

## 2015-03-13 NOTE — H&P (Signed)
PATIENT NAME:  Sandy Harris, Sandy Harris MR#:  [id number] DATE OF BIRTH:  May 04, 1924  DATE OF ADMISSION:  05/30/2014  REFERRING PHYSICIAN: Dr. Lenard Lance   PRIMARY CARE PHYSICIAN: Dr. Aram Beecham   CHIEF COMPLAINT: Vomiting, diarrhea and generalized weakness.   HISTORY OF PRESENT ILLNESS: This is an 81 year old female with known history of diabetes, hypertension, and hyperlipidemia, who presents with complaints of abdominal cramping, vomiting and diarrhea. The patient was admitted last month with urinary tract infection, with urine culture growing Escherichia coli, pansensitive. The patient had CT abdomen and pelvis today, which did not show any acute findings. No recurrence of vomiting in the ED. Afebrile, but had significant leukocytosis at 25,000. The patient's urinalysis is suggestive of urinary tract infection with positive nitrite and +2 leukocyte esterase, with 189 white blood cells. The patient was started on levofloxacin in the Emergency Department.   PAST MEDICAL HISTORY: 1.  Hypertension.  2.  Hyperlipidemia.  3.  Diabetes mellitus.    PAST SURGICAL HISTORY: None.   ALLERGIES: PENICILLIN.   HOME MEDICATIONS: 1.  Aspirin 81 mg oral daily.  2.  Protonix 40 mg daily.  3.  Lopressor 25 mg b.i.d.  4.  Synthroid 125 mcg oral daily.  5.  Actos 45 mg daily.  6.  Metformin 500 mg oral b.i.d.  7.  Potassium 20 mEq oral daily.    SOCIAL HISTORY: The patient lives currently at home with her daughter. Recently left [facility]. No smoking. No alcohol.   FAMILY HISTORY: Significant for hypertension.   REVIEW OF SYSTEMS: CONSTITUTIONAL: The patient denies fever, chills. Reports fatigue, weakness, poor appetite.  EYES: Denies blurry vision, double vision, inflammation.  ENT: Denies tinnitus, ear pain, hearing loss, epistaxis or discharge.  RESPIRATORY: Denies cough, wheezing, hemoptysis.  CARDIOVASCULAR: No chest pain, edema, palpitation, syncope.  GASTROINTESTINAL: Reports nausea,  vomiting, abdominal cramping and diarrhea. Denies hematemesis, melena, jaundice.  GENITOURINARY: Denies dysuria, hematuria, or renal colic.  ENDOCRINE: Denies polyuria, polydipsia, heat or cold intolerance.  HEMATOLOGY: Denies anemia, easy bruising, bleeding diathesis.  INTEGUMENT: Denies acne, rash or skin lesion.  MUSCULOSKELETAL: Denies any swelling, gout, cramps.  NEUROLOGIC: Denies CVA, transient ischemic attack, tremors, vertigo, ataxia.  PSYCHIATRIC: Denies anxiety, insomnia or depression.   PHYSICAL EXAMINATION: VITAL SIGNS: Temperature 97.8, pulse 82, respiratory rate 14, blood pressure 140/62.  GENERAL: Frail, elderly female who looks comfortable in bed in no apparent distress.  HEENT: Head atraumatic, normocephalic. Pupils equal, reactive to light. Pink conjunctivae. Anicteric sclerae. Dry oral mucosa.  NECK: Supple. No thyromegaly. No JVD.  CHEST: Good air entry bilaterally. No wheezing, rales, rhonchi.  CARDIOVASCULAR: S1, S2 heard. No rubs, murmur or gallops.  ABDOMEN: Soft, nontender, nondistended. Bowel sounds present. No rebound, no guarding.  EXTREMITIES: Mild pitting edema bilaterally. No clubbing. No cyanosis. Pedal pulses +2 bilaterally.  PSYCHIATRIC: Appropriate affect. Awake, alert oriented x3. Intact judgment and insight.  NEUROLOGIC: Cranial nerves grossly intact. Motor 5/5. No focal deficits. Sensation intact to light touch bilaterally and symmetrically.  MUSCULOSKELETAL: No joint effusion or erythema.  SKIN: Warm and dry. Decreased skin turgor.   PERTINENT LABORATORY DATA: Urinalysis: Positive nitrites, positive leukocyte esterase, 189 white blood cells. Glucose 437, BUN 20, creatinine 1.3, sodium 137, potassium 4.7, CO2 26, anion gap 6, ALT 15, AST 14, alkaline phosphatase 115. Troponin less than 0.02. White blood cell 25,000, hemoglobin 11.2, hematocrit 34, platelets 532.   IMAGING STUDIES: CT abdomen and pelvis with IV contrast: No acute intra-abdominal or  pelvic pathology.   ASSESSMENT AND  PLAN: 1.  Urinary tract infection. The patient with history of urinary tract infection in the past. Her urine cultures then was growing Escherichia coli, pansensitive. She will be started on levofloxacin. We will follow on urine culture. We will follow on blood cultures.  2.  Diarrhea. The patient has no acute finding on CT abdomen. We will check Clostridium difficile.  3.  Hypertension. Blood pressure acceptable. Continue with home medications.  4.  Hyperlipidemia. Continue with statin.  5.  Diabetes mellitus, uncontrolled: We will hydrate patient appropriately. We will hold her metformin given she recieved IV contrast. We will continue her on Actos. Will add insulin sliding scale.  5.  Deep vein thrombosis prophylaxis. Subcutaneous heparin.   CODE STATUS: The patient reports she is do not resuscitate. Daughter at bedside.   TOTAL TIME SPENT ON ADMISSION AND PATIENT CARE: 55 minutes.    ____________________________ Starleen Armsawood S. Elgergawy, MD dse:cg D: 05/30/2014 01:53:26 ET T: 05/30/2014 02:19:22 ET JOB#: [id number]  cc: Starleen Armsawood S. Elgergawy, MD, <Dictator> DAWOOD Teena IraniS ELGERGAWY MD ELECTRONICALLY SIGNED 05/31/2014 5:28

## 2015-03-13 NOTE — H&P (Signed)
PATIENT NAME:  Sandy Harris, Sandy Harris MR#:  [id number] DATE OF BIRTH:  [birth year]  DATE OF ADMISSION:  04/21/2014  PRIMARY CARE PHYSICIAN: Dr. Aram Beecham.   REFERRING PHYSICIAN: Dr. Carollee Massed  CHIEF COMPLAINT: Generalized weakness, fever.   HISTORY OF PRESENT ILLNESS: This patient is an 81 year old female with history of diabetes mellitus, hypertension, presented to the Emergency Department with complaints of generalized weakness, chills, unable to stand up since this morning. The patient was doing well yesterday. Concerning this, the patient is brought to the Emergency Department. The patient also had an episode of fall. On workup in the Emergency Department, the patient is found to have elevated white blood cell count of 19,000. Urine is strongly suggestive of urinary tract infection, with positive nitrites and urine WBC of 120. The patient received Rocephin in the Emergency Department.   PAST MEDICAL HISTORY: 1.  Hypertension.  2.  Hyperlipidemia.  3.  Diabetes mellitus.    PAST SURGICAL HISTORY: None.   ALLERGIES: PENICILLIN.  HOME MEDICATIONS: The patient does not have any list of medications for now.   SOCIAL HISTORY: No history of smoking, drinking alcohol or using illicit drugs. Lives by herself.   FAMILY HISTORY: History of hypertension.   REVIEW OF SYSTEMS: CONSTITUTIONAL: Generalized weakness.  EYES: No change in vision.  ENT: No change in hearing.  RESPIRATORY: No cough, shortness of breath.  CARDIOVASCULAR: No chest pain, palpations.  GASTROINTESTINAL: Has mild nausea. No episodes of vomiting or diarrhea.   GENITOURINARY: Has mild dysuria.  SKIN: Rash on the right leg.  HEMATOLOGIC: No easy bruising or bleeding.  MUSCULOSKELETAL: No joint pains and aches.  ENDOCRINE: Has diagnosis of diabetes mellitus.   PHYSICAL EXAMINATION: GENERAL: A well-built, well-nourished, age-appropriate female, lying down in the bed, not in distress.  VITAL SIGNS: Temperature 98.2, pulse 98,  blood pressure 186/84, respiratory rate of 20, oxygen saturation is 98% on room air.  HEENT: Head normocephalic, atraumatic. Eyes: No scleral icterus. Conjunctivae normal. Pupils equal and react to light. Extraocular movements are intact. Mucous membranes moist. No pharyngeal erythema.  NECK: Supple. No lymphadenopathy. No JVD. No carotid bruit. No thyromegaly.  CHEST: No focal tenderness.  LUNGS: Bilaterally clear to auscultation.  HEART: S1, S2 regular. No murmurs are heard.  ABDOMEN: Bowel sounds present. Soft, nontender, nondistended. Could not appreciate any hepatosplenomegaly.  EXTREMITIES: Has excoriations, in the lower extremities, right worse than the left.  NEUROLOGIC: The patient is alert, oriented to place, person, and time. Cranial nerves II through XII intact. Motor 5/5 in upper and lower extremities.   LABORATORY DATA: Urinalysis: 2+ leukocyte esterase, nitrites positive. WBC of 121. Bacteria positive.   Chest x-ray, one view portable: Mild cardiomegaly, mild interstitial prominence, likely reflects pulmonary edema with left lung atelectasis.   BMP: BUN 30, creatinine of 1.14. The rest of the liver enzymes within normal limits. CBC: WBC of 19.7, hemoglobin 11.4, platelet count of 211.   ASSESSMENT AND PLAN: This patient is an 81 year old female who comes to the Emergency Department with sepsis secondary to urinary tract infection.   1.  Sepsis. Will obtain blood and urine cultures. Keep the patient on Rocephin. Follow up with the culture data.  2.  Urinary tract infection. The patient denies having any back pain.  3.  Diabetes mellitus. Keep the patient on sliding scale insulin. The patient's current blood sugar is up to 165.  4.  Debility. Will involve physical therapy, occupational therapy.  5.  Keep the patient on deep vein thrombosis prophylaxis with  Lovenox.   TIME SPENT: 50 minutes.     ____________________________ Susa GriffinsPadmaja Esiah Bazinet, MD pv:cg D: 04/21/2014 23:33:57  ET T: 04/22/2014 00:40:24 ET JOB#: [id number]  cc: Susa GriffinsPadmaja Summar Mcglothlin, MD, <Dictator> Duane LopeJeffrey D. Judithann SheenSparks, MD Susa GriffinsPADMAJA Helen Winterhalter MD ELECTRONICALLY SIGNED 04/30/2014 21:09

## 2015-03-13 NOTE — Discharge Summary (Signed)
PATIENT NAME:  Sandy Harris, Sandy Harris MR#:  [id number] DATE OF BIRTH:  1924/01/07  DATE OF ADMISSION:  05/30/2014 DATE OF PLANNED DISCHARGE:  06/02/2014   DISCHARGE DIAGNOSES:  1. Encephalopathy.  2. Urinary tract infection.  3. Type 2 diabetes.  4. Diarrhea and nausea, improved.   DISCHARGE MEDICATIONS: [facility]. Briefly, will be on Levaquin 250 daily for 5 days, and she will stay on her regular home meds. Metformin is being restarted as she is no longer ill.   HISTORY AND PHYSICAL: Please see detailed history and physical done on admission.   HOSPITAL COURSE: The patient was admitted after [facility] she says before she was told to do so. She had evidence for urinary tract infection on her urinalysis, although culture was not done on admission prior to starting antibiotics. Blood cultures were done and were negative. She had a CT of her abdomen and pelvis and had no acute intra-abdominal pathology noted. She had leukocytosis on admission that was improved with antibiotic therapy. She was hyperglycemic, which improved markedly. She improved further with the re-addition of metformin. This should be followed with a sliding scale at facility.   TIME SPENT: Please note, it took approximately 35 minutes to do all discharge tasks today.   ____________________________ Marya AmslerMarshall W. Dareen PianoAnderson, MD mwa:lb D: 06/02/2014 08:12:45 ET T: 06/02/2014 08:19:45 ET JOB#: [id number]  cc: Marya AmslerMarshall W. Dareen PianoAnderson, MD, <Dictator> Lauro RegulusMARSHALL W Brynlea Spindler MD ELECTRONICALLY SIGNED 06/03/2014 8:17

## 2015-08-18 ENCOUNTER — Ambulatory Visit: Payer: Commercial Managed Care - HMO | Admitting: Neurology

## 2015-08-21 DEATH — deceased

## 2016-03-29 IMAGING — CR DG CHEST 1V PORT
1 series · 1 of 1 positions shown · non-contrast
Comparison: None.

CLINICAL DATA: Weakness, leukocytosis, sepsis.

EXAM:
PORTABLE CHEST - 1 VIEW

[ap]
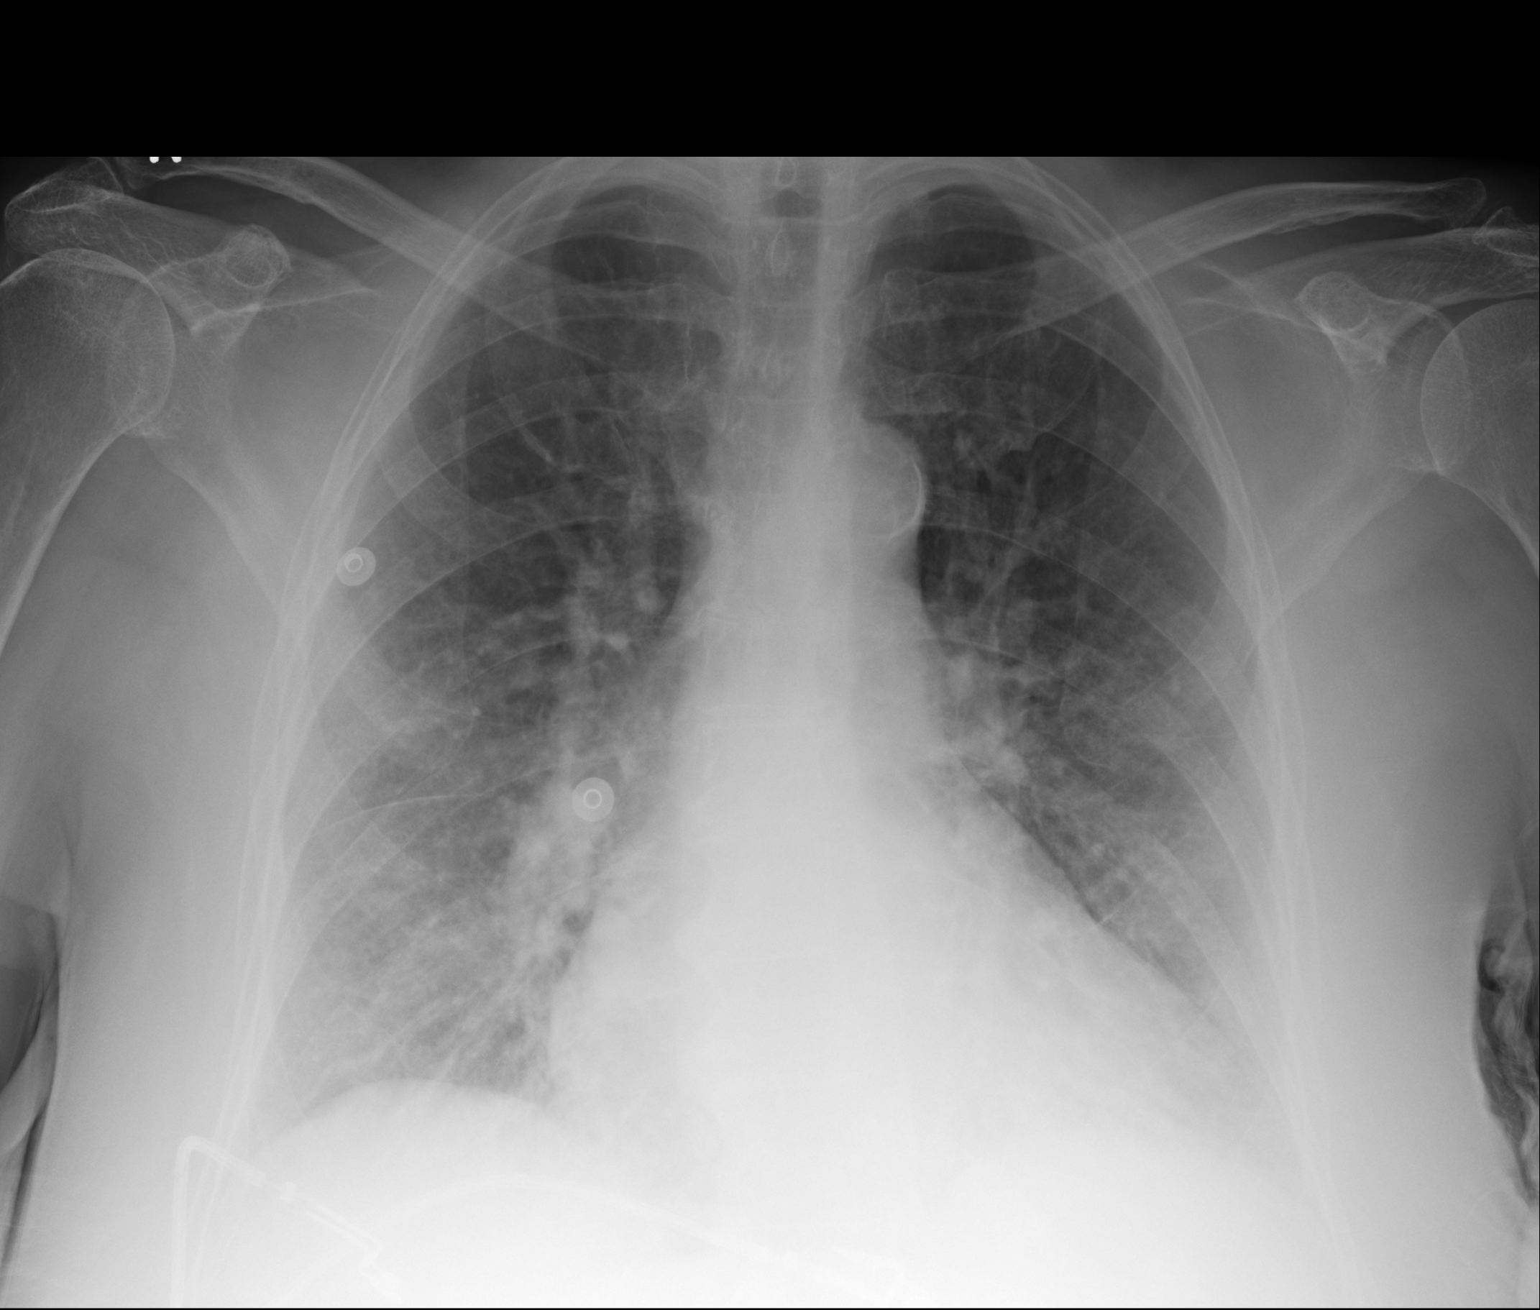

[1 of 1 positions shown; findings below may reference images not displayed]

FINDINGS: Cardiac silhouette appears mildly enlarged, mediastinal silhouette
is nonsuspicious, mildly calcified aortic knob. Central pulmonary
vasculature congestion mild interstitial prominence. Minimal patchy
airspace opacity in retrocardiac region. Subcentimeter nodule
projects in left lung zone. No pleural effusions. No pneumothorax.

Multiple EKG lines overlie the patient and may obscure subtle
underlying pathology. Osteopenia. Moderate degenerative change of
thoracic spine.
IMPRESSION: Mild cardiomegaly, mild interstitial prominence likely reflects
pulmonary edema with left lung base atelectasis, confluent femur
even possible pneumonia.

Subcentimeter nodule left midlung zone, consider follow-up CT of the
chest after resolution of acute symptoms, as clinically indicated.

  By: Evelin Luca Tinnyei

## 2016-04-02 IMAGING — CR DG CHEST 2V
1 series · 2 of 2 positions shown · non-contrast
Comparison: 04/24/2014

CLINICAL DATA: Pneumonia, weakness

EXAM:
CHEST  2 VIEW

[Series 1: x chest ap · 0.14mm/px · 2 of 2 slices shown]
[im 1/2]
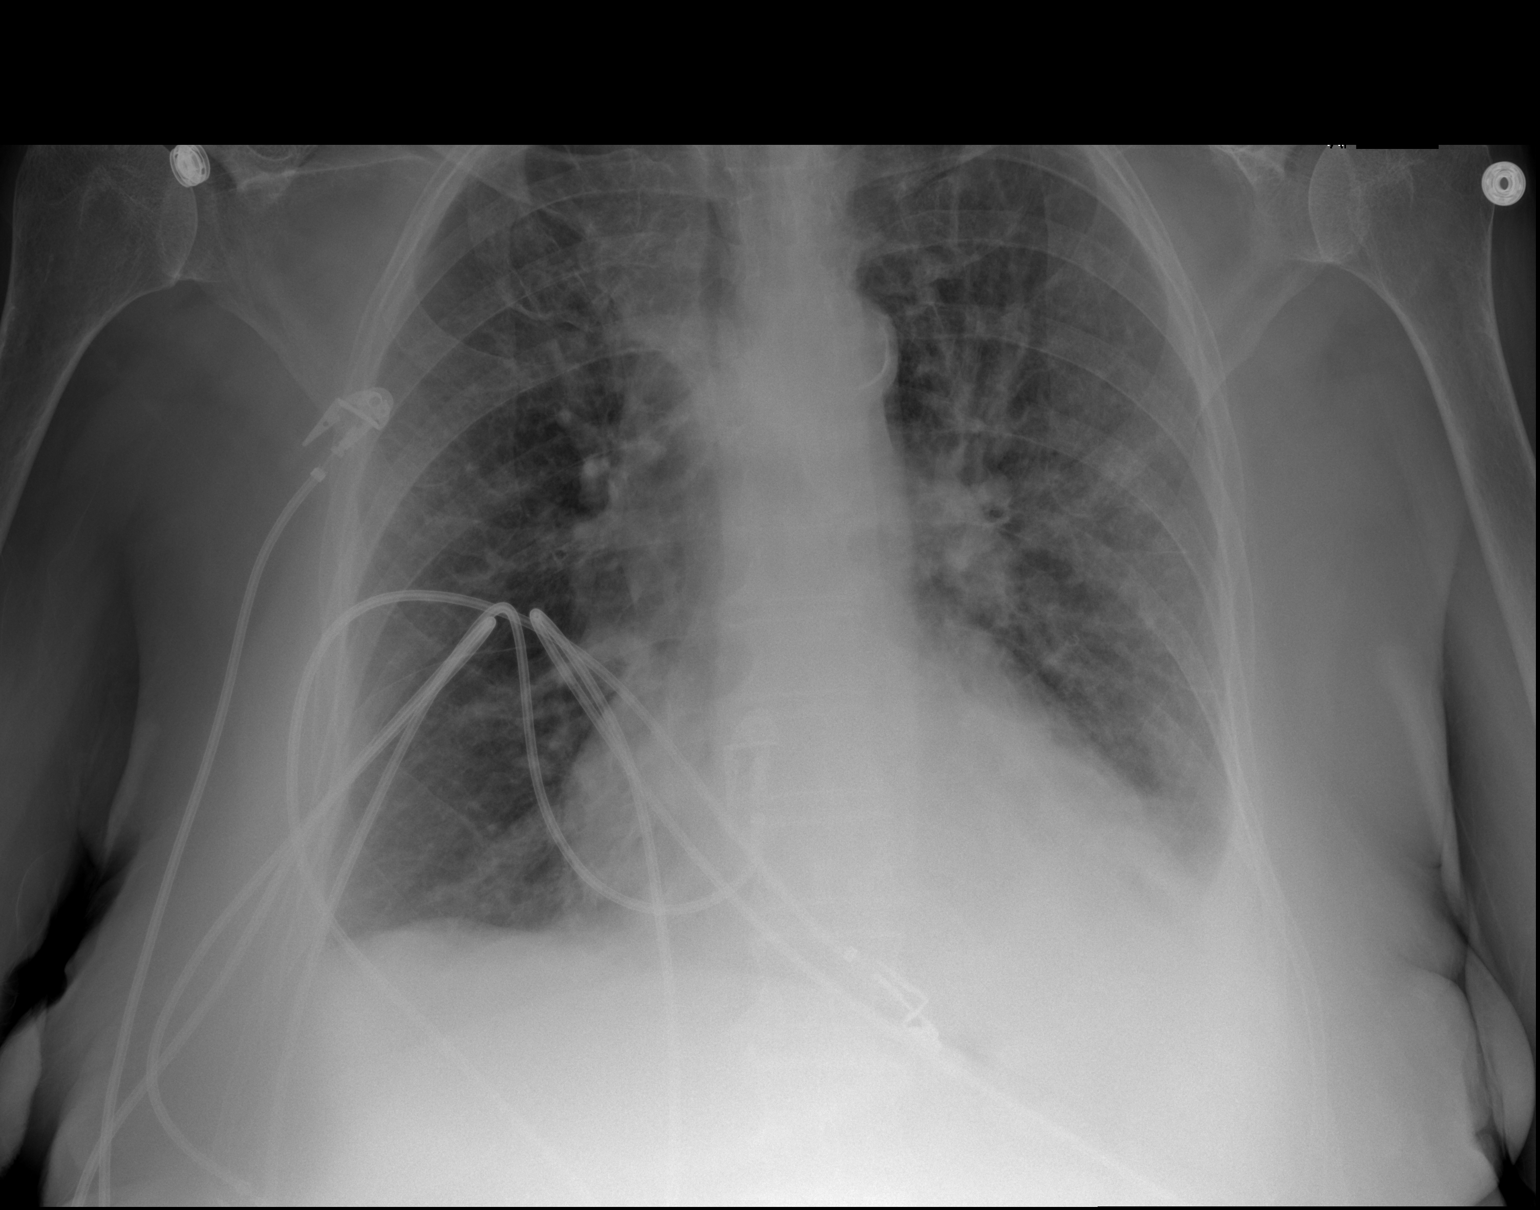
[im 2/2]
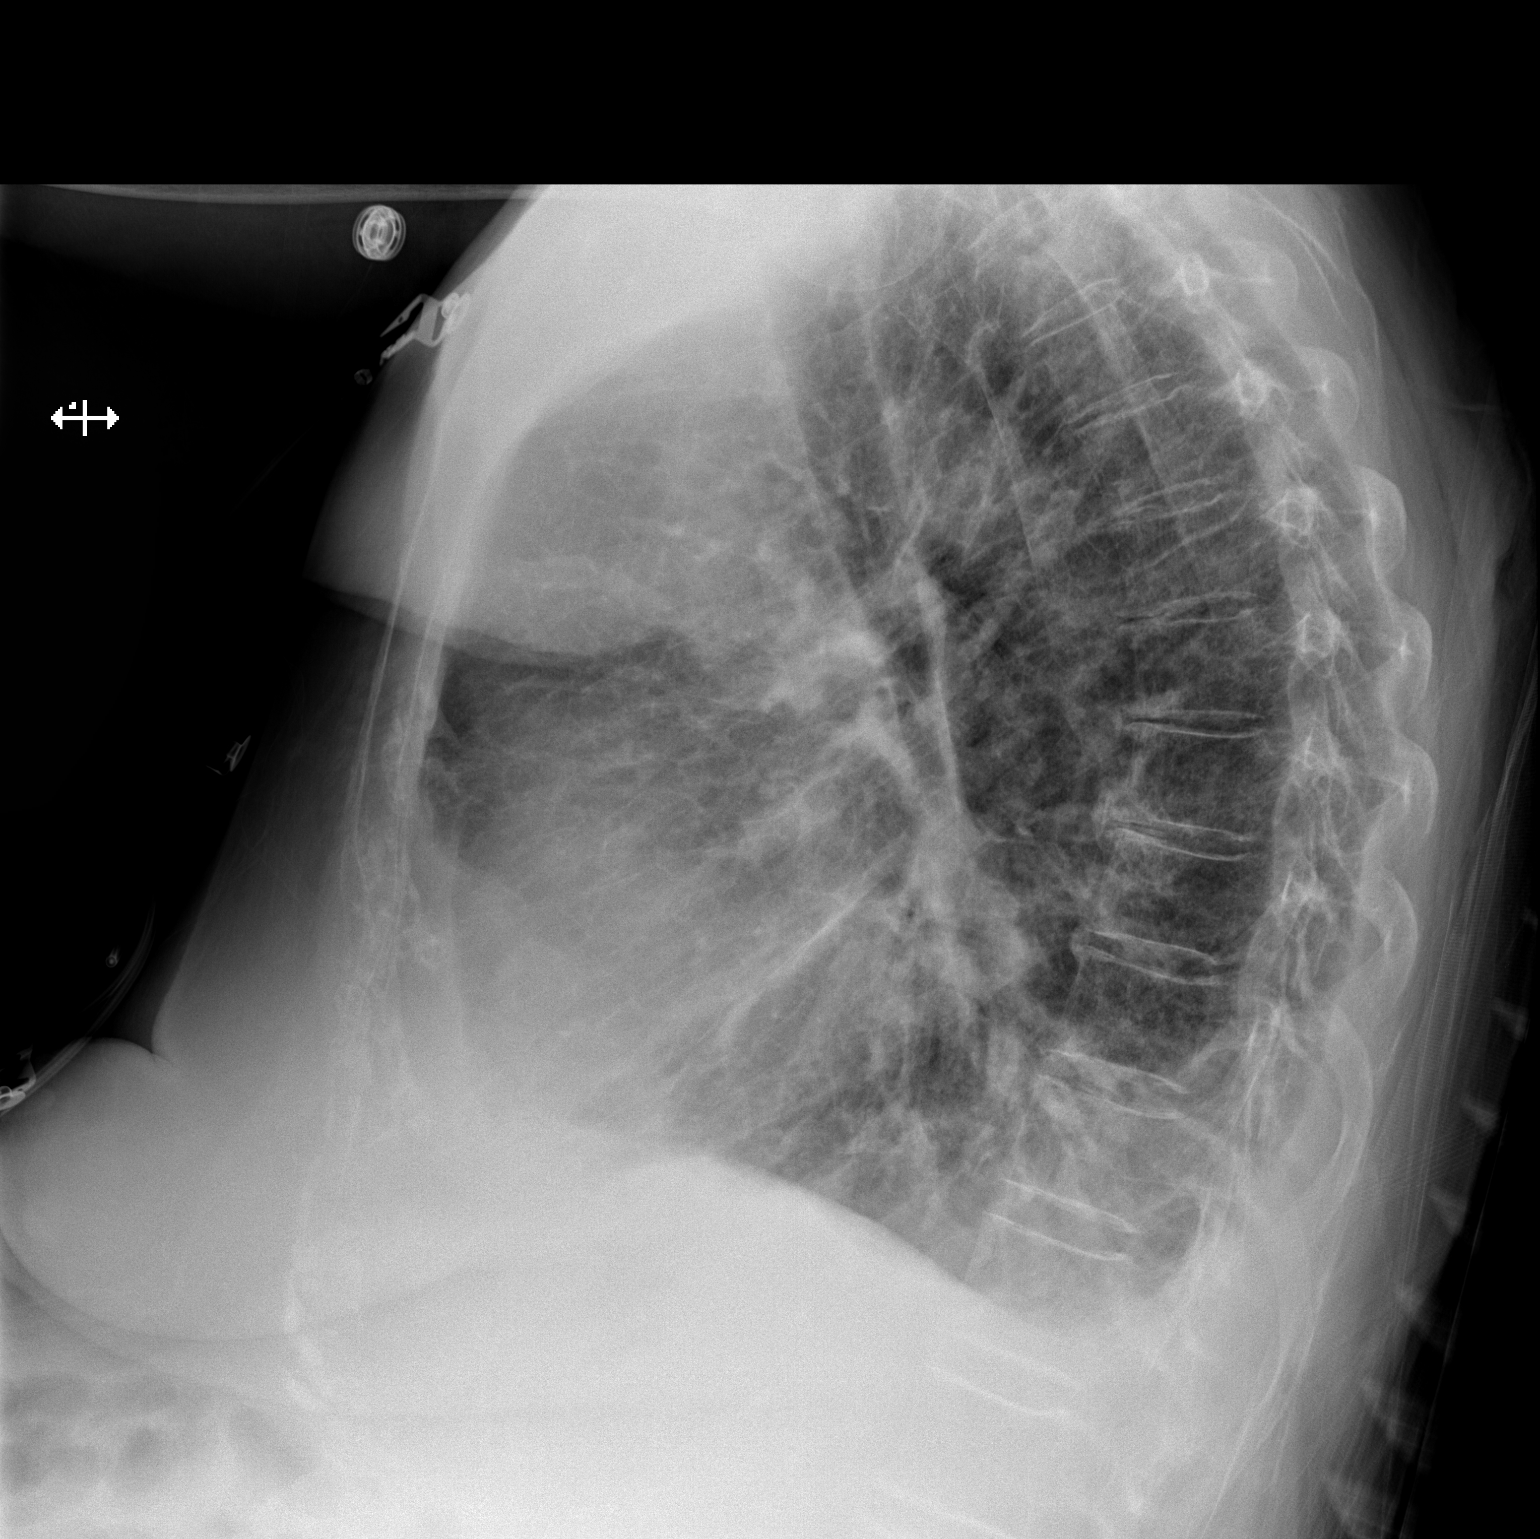

[2 of 2 positions shown; findings below may reference images not displayed]

FINDINGS: Cardiomegaly with mild interstitial edema. Small left pleural
effusion, increased. Associated left lower lobe opacity, likely
atelectasis, pneumonia not excluded.

No pneumothorax.

Degenerative changes of the visualized thoracolumbar spine.
IMPRESSION: Cardiomegaly with mild interstitial edema. Small left pleural
effusion, increased.

Associated left lower lobe opacity, likely atelectasis, pneumonia
not excluded.
